# Patient Record
Sex: Male | Born: 1940 | Race: White | Hispanic: No | Marital: Married | Smoking: Former smoker
Health system: Southern US, Community
[De-identification: ages and names within clinical notes are randomized; demographics above are authoritative.]

## PROBLEM LIST (undated history)

## (undated) DIAGNOSIS — I10 Essential (primary) hypertension: Secondary | ICD-10-CM

---

## 2004-11-07 ENCOUNTER — Emergency Department: Payer: Self-pay | Admitting: Internal Medicine

## 2008-06-17 ENCOUNTER — Encounter: Payer: Self-pay | Admitting: Internal Medicine

## 2008-07-01 ENCOUNTER — Encounter: Payer: Self-pay | Admitting: Internal Medicine

## 2009-07-10 ENCOUNTER — Ambulatory Visit: Payer: Self-pay | Admitting: Internal Medicine

## 2019-07-31 ENCOUNTER — Other Ambulatory Visit: Payer: Self-pay

## 2019-07-31 ENCOUNTER — Ambulatory Visit (INDEPENDENT_AMBULATORY_CARE_PROVIDER_SITE_OTHER): Payer: Self-pay

## 2019-07-31 ENCOUNTER — Ambulatory Visit
Admission: EM | Admit: 2019-07-31 | Discharge: 2019-07-31 | Disposition: A | Discharge status: Home / Self Care | Payer: Self-pay | Attending: Family Medicine | Admitting: Family Medicine

## 2019-07-31 ENCOUNTER — Encounter: Payer: Self-pay | Admitting: Emergency Medicine

## 2019-07-31 DIAGNOSIS — W19XXXA Unspecified fall, initial encounter: Secondary | ICD-10-CM

## 2019-07-31 DIAGNOSIS — S7001XA Contusion of right hip, initial encounter: Secondary | ICD-10-CM

## 2019-07-31 DIAGNOSIS — M25531 Pain in right wrist: Secondary | ICD-10-CM

## 2019-07-31 DIAGNOSIS — M25551 Pain in right hip: Secondary | ICD-10-CM

## 2019-07-31 DIAGNOSIS — S40011A Contusion of right shoulder, initial encounter: Secondary | ICD-10-CM

## 2019-07-31 DIAGNOSIS — S60211A Contusion of right wrist, initial encounter: Secondary | ICD-10-CM

## 2019-07-31 DIAGNOSIS — S40021A Contusion of right upper arm, initial encounter: Secondary | ICD-10-CM

## 2019-07-31 DIAGNOSIS — M25511 Pain in right shoulder: Secondary | ICD-10-CM

## 2019-07-31 HISTORY — DX: Essential (primary) hypertension: I10

## 2019-07-31 MED ORDER — ACETAMINOPHEN 500 MG PO TABS
1000.0000 mg | ORAL_TABLET | Freq: Once | ORAL | Status: AC
  Administered 2019-07-31: 1000 mg via ORAL

## 2019-07-31 NOTE — ED Provider Notes (Signed)
[facility]    CSN: [id number] Arrival date & time: 07/31/19  1638      History   Chief Complaint Chief Complaint  Patient presents with  . Shoulder Pain  . Arm Pain  . Hip Pain  . Hand Pain    HPI REX OESTERLE is a 78 y.o. male.   78 yo male with a c/o right shoulder, wrist and hip pain after falling down some steps today. States that he tripped on step. Denies hitting his head or loss of consciousness.    Shoulder Pain Arm Pain  Hip Pain  Hand Pain    Past Medical History:  Diagnosis Date  . Hypertension     There are no problems to display for this patient.   History reviewed. No pertinent surgical history.     Home Medications    Prior to Admission medications   Not on File    Family History History reviewed. No pertinent family history.  Social History Social History   Tobacco Use  . Smoking status: Never Smoker  . Smokeless tobacco: Never Used  Substance Use Topics  . Alcohol use: Never  . Drug use: Never     Allergies   Patient has no known allergies.   Review of Systems Review of Systems   Physical Exam Triage Vital Signs ED Triage Vitals  Enc Vitals Group     BP 07/31/19 1655 (!) 162/76     Pulse Rate 07/31/19 1655 96     Resp 07/31/19 1655 18     Temp 07/31/19 1655 98.2 F (36.8 C)     Temp Source 07/31/19 1655 Oral     SpO2 07/31/19 1655 99 %     Weight 07/31/19 1652 185 lb (83.9 kg)     Height 07/31/19 1652 5\' 7"  (1.702 m)     Head Circumference --      Peak Flow --      Pain Score 07/31/19 1652 9     Pain Loc --      Pain Edu? --      Excl. in [place]? --    No data found.  Updated Vital Signs BP (!) 162/76 (BP Location: Right Arm)   Pulse 96   Temp 98.2 F (36.8 C) (Oral)   Resp 18   Ht 5\' 7"  (1.702 m)   Wt 83.9 kg   SpO2 99%   BMI 28.98 kg/m   Visual Acuity Right Eye Distance:   Left Eye Distance:   Bilateral Distance:    Right Eye Near:   Left Eye Near:    Bilateral Near:      Physical Exam Vitals and nursing note reviewed.  Constitutional:      General: He is not in acute distress.    Appearance: He is not toxic-appearing or diaphoretic.  Musculoskeletal:     Right shoulder: Bony tenderness present. No swelling, deformity, effusion, laceration or crepitus. Normal range of motion. Normal strength. Normal pulse.     Right wrist: Bony tenderness present. No swelling, deformity, effusion, lacerations, snuff box tenderness or crepitus. Normal range of motion. Normal pulse.     Right hip: Bony tenderness present. No deformity, lacerations or crepitus. Normal range of motion. Normal strength.     Comments: Extremities neurovascularly intact  Neurological:     Mental Status: He is alert.      UC Treatments / Results  Labs (all labs ordered are listed, but only abnormal results are displayed) Labs Reviewed -  No data to display  EKG   Radiology DG Shoulder Right  Result Date: 07/31/2019 CLINICAL DATA:  Status post fall. EXAM: RIGHT SHOULDER - 2+ VIEW COMPARISON:  None. FINDINGS: There is no evidence of fracture or dislocation. There is no evidence of arthropathy or other focal bone abnormality. Soft tissues are unremarkable. IMPRESSION: No acute osseous injury of the right shoulder. Electronically Signed   By: Elige Ko   On: 07/31/2019 17:47   DG Wrist Complete Right  Result Date: 07/31/2019 CLINICAL DATA:  Fall down stairs, pain in the right wrist/hand EXAM: RIGHT WRIST - COMPLETE 3+ VIEW COMPARISON:  None. FINDINGS: No acute bony findings. Minimal ridging along the lateral margin of the mid scaphoid without compelling findings of scaphoid fracture. Unremarkable appearance of the pronator fat pad on the lateral projection. IMPRESSION: 1. No significant abnormality identified. If the patient has point tenderness over the anatomic snuffbox, then cross-sectional imaging workup or presumptive treatment for occult scaphoid fracture might be considered.  Electronically Signed   By: Gaylyn Rong M.D.   On: 07/31/2019 17:50   DG Humerus Right  Result Date: 07/31/2019 CLINICAL DATA:  Fall down 5 stairs, right arm pain. EXAM: RIGHT HUMERUS - 2+ VIEW COMPARISON:  None. FINDINGS: There is no evidence of fracture or other focal bone lesions. Soft tissues are unremarkable. IMPRESSION: Negative. Electronically Signed   By: Gaylyn Rong M.D.   On: 07/31/2019 17:48   DG Hip Unilat With Pelvis 2-3 Views Right  Result Date: 07/31/2019 CLINICAL DATA:  Fall down stairs today.  Right hip pain. EXAM: DG HIP (WITH OR WITHOUT PELVIS) 2-3V RIGHT COMPARISON:  None. FINDINGS: Mild spurring of the right femoral head and acetabulum. No well-defined hip fracture. Penile implant. IMPRESSION: 1. No acute findings.  Minimal spurring of the right hip. Electronically Signed   By: Gaylyn Rong M.D.   On: 07/31/2019 17:51    Procedures Procedures (including critical care time)  Medications Ordered in UC Medications  acetaminophen (TYLENOL) tablet 1,000 mg (1,000 mg Oral Given 07/31/19 1711)    Initial Impression / Assessment and Plan / UC Course  I have reviewed the triage vital signs and the nursing notes.  Pertinent labs & imaging results that were available during my care of the patient were reviewed by me and considered in my medical decision making (see chart for details).      Final Clinical Impressions(s) / UC Diagnoses   Final diagnoses:  Fall  Contusion of multiple sites of right shoulder and upper arm, initial encounter  Contusion of right hip, initial encounter  Contusion of right wrist, initial encounter     Discharge Instructions     Rest, ice, tylenol    ED Prescriptions    None      1. X-ray results (negative for fracture) and diagnosis reviewed with patient 2. Recommend supportive treatment as above 3. Follow-up prn if symptoms worsen or don't improve  PDMP not reviewed this encounter.   Payton Mccallum,  MD 07/31/19 2013

## 2019-07-31 NOTE — Discharge Instructions (Signed)
Rest , ice, tylenol

## 2019-07-31 NOTE — ED Triage Notes (Signed)
Patient c/o falling down 5 steps today. He is c/o right shoulder, right arm, right hip and right hand pain. Denies LOC.

## 2020-04-29 IMAGING — CR DG HIP (WITH OR WITHOUT PELVIS) 2-3V*R*
3 series · 3 of 3 positions shown · non-contrast
Comparison: None.

CLINICAL DATA: Fall down stairs today.  Right hip pain.

EXAM:
DG HIP (WITH OR WITHOUT PELVIS) 2-3V RIGHT

[hip ap]
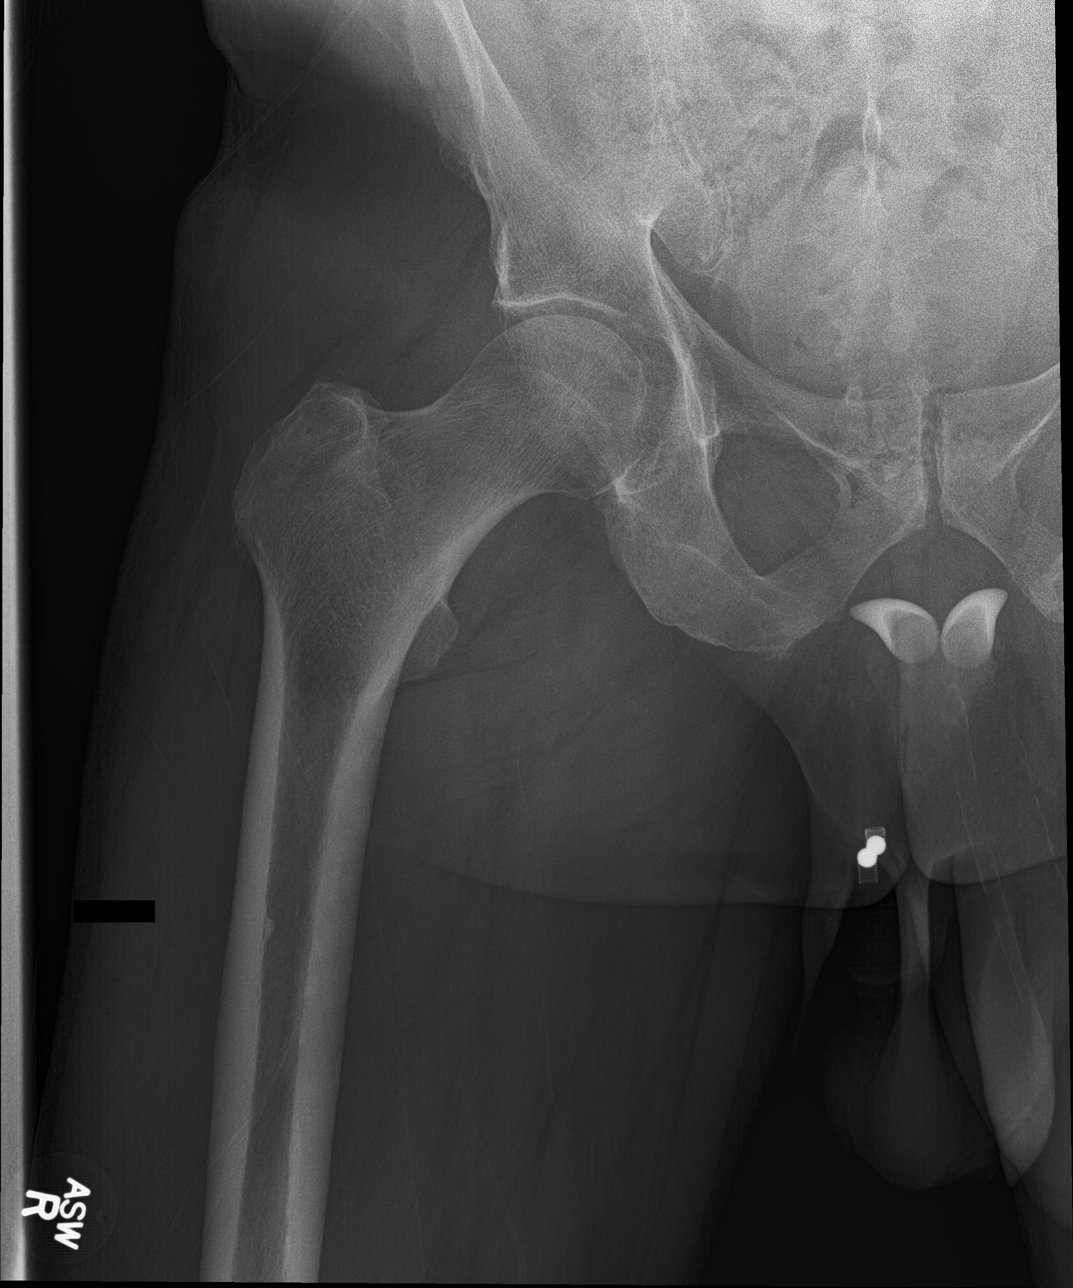

[hip lat]
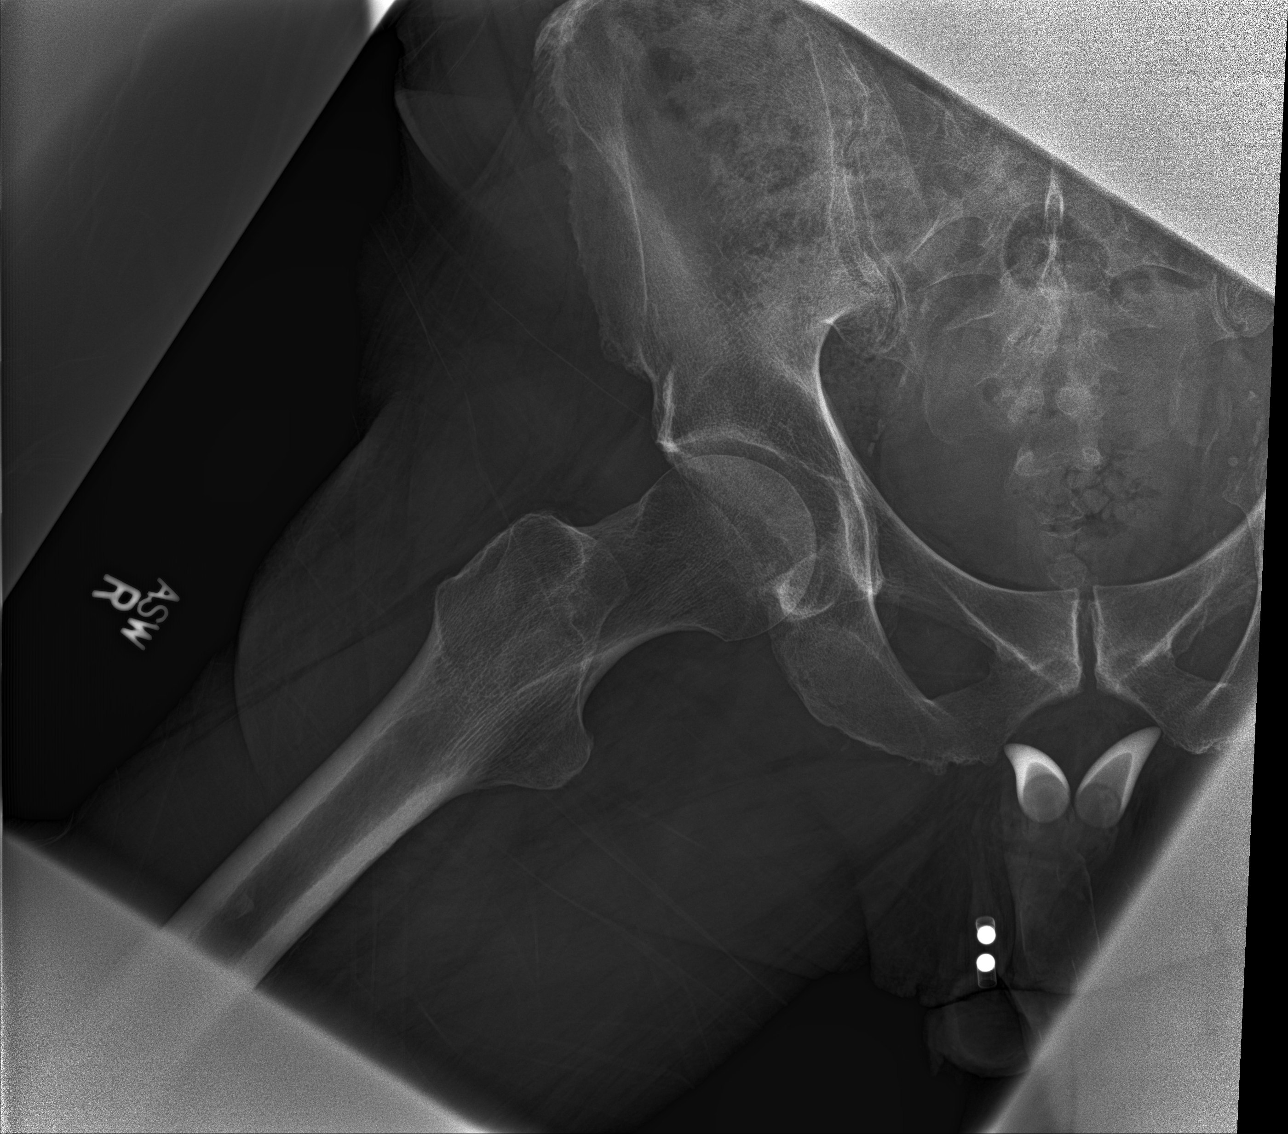

[pelvis ap]
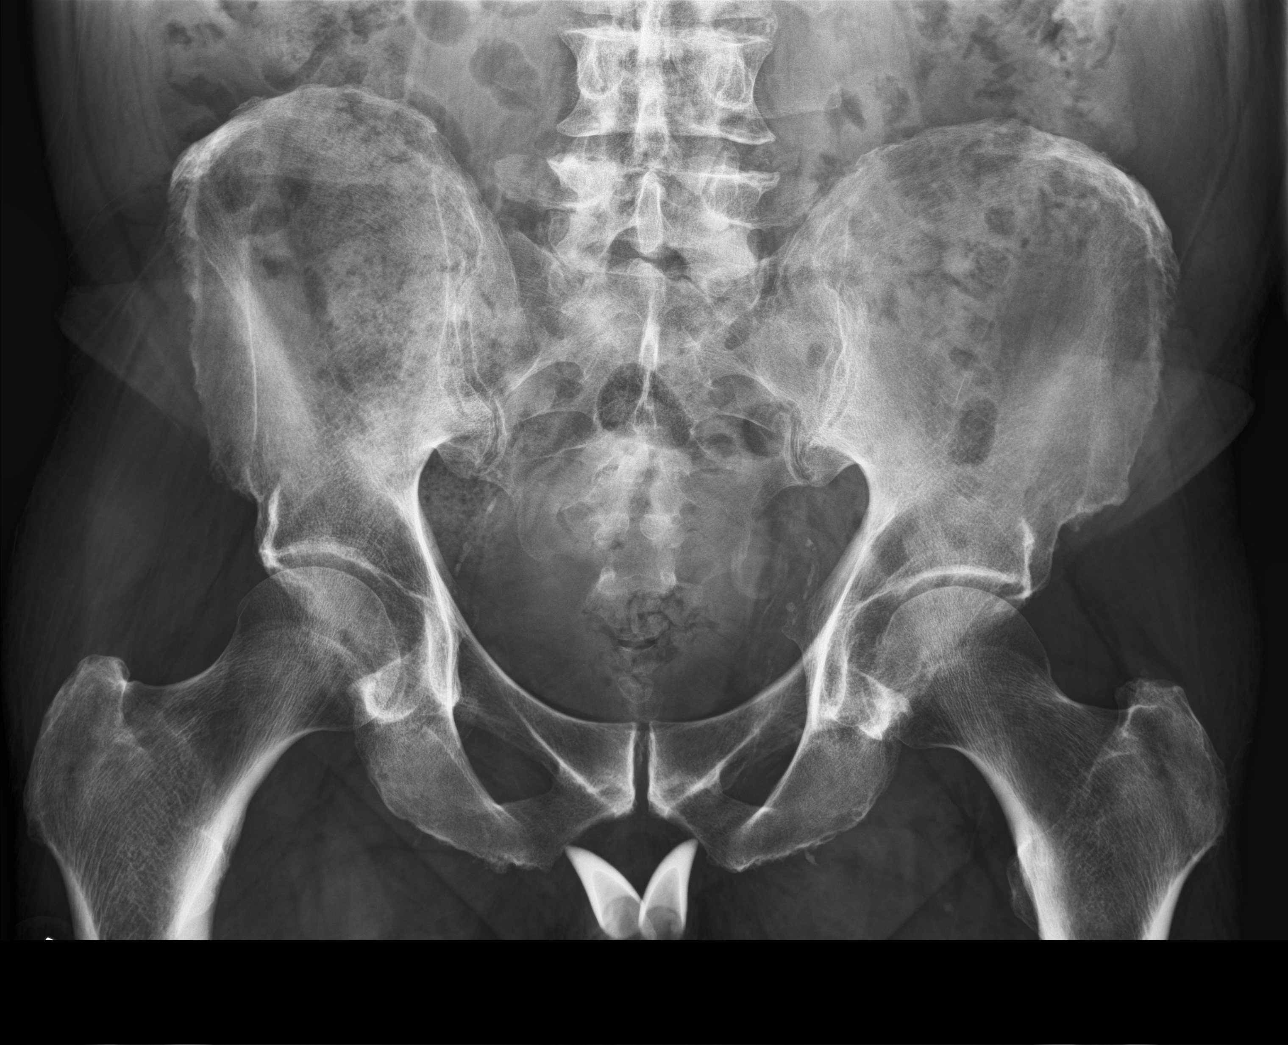

[3 of 3 positions shown; findings below may reference images not displayed]

FINDINGS: Mild spurring of the right femoral head and acetabulum. No
well-defined hip fracture. Penile implant.
IMPRESSION: 1. No acute findings.  Minimal spurring of the right hip.

## 2022-04-25 ENCOUNTER — Other Ambulatory Visit: Payer: Self-pay

## 2022-04-25 ENCOUNTER — Encounter: Payer: Self-pay | Attending: Cardiology

## 2022-04-25 DIAGNOSIS — Z952 Presence of prosthetic heart valve: Secondary | ICD-10-CM

## 2022-04-25 NOTE — Progress Notes (Signed)
Virtual Visit completed. Patient informed on EP and RD appointment and 6 Minute walk test. Patient also informed of patient health questionnaires on My Chart. Patient Verbalizes understanding. Visit diagnosis can be found in [facility] 02/14/2022.

## 2022-05-04 ENCOUNTER — Encounter: Payer: No Typology Code available for payment source | Attending: Nurse Practitioner

## 2022-05-04 VITALS — Ht 66.4 in | Wt 184.4 lb

## 2022-05-04 DIAGNOSIS — Z48812 Encounter for surgical aftercare following surgery on the circulatory system: Secondary | ICD-10-CM | POA: Diagnosis not present

## 2022-05-04 DIAGNOSIS — Z952 Presence of prosthetic heart valve: Secondary | ICD-10-CM | POA: Insufficient documentation

## 2022-05-04 NOTE — Patient Instructions (Signed)
Patient Instructions  Patient Details  Name: Dustin Lara MRN: [id number] Date of Birth: 10/27/1940 Referring Provider:  Toya Smothers, PA  Below are your personal goals for exercise, nutrition, and risk factors. Our goal is to help you stay on track towards obtaining and maintaining these goals. We will be discussing your progress on these goals with you throughout the program.  Initial Exercise Prescription:  Initial Exercise Prescription - 05/04/22 1500       Date of Initial Exercise RX and Referring Provider   Date 05/04/22    Referring Provider Thakkar      Oxygen   Maintain Oxygen Saturation 88% or higher      Treadmill   MPH 1.8    Grade 0.5    Minutes 15    METs 2.5      Recumbant Bike   Level 1    RPM 50    Watts 12    Minutes 15    METs 1.55      NuStep   Level 2    SPM 80    Minutes 15    METs 1.55      T5 Nustep   Level 1    SPM 80    Minutes 15    METs 1.55      Prescription Details   Frequency (times per week) 3    Duration Progress to 30 minutes of continuous aerobic without signs/symptoms of physical distress      Intensity   THRR 40-80% of Max Heartrate 93-123    Ratings of Perceived Exertion 11-13    Perceived Dyspnea 0-4      Progression   Progression Continue to progress workloads to maintain intensity without signs/symptoms of physical distress.      Resistance Training   Training Prescription Yes    Weight 3 lb    Reps 10-15             Exercise Goals: Frequency: Be able to perform aerobic exercise two to three times per week in program working toward 2-5 days per week of home exercise.  Intensity: Work with a perceived exertion of 11 (fairly light) - 15 (hard) while following your exercise prescription.  We will make changes to your prescription with you as you progress through the program.   Duration: Be able to do 30 to 45 minutes of continuous aerobic exercise in addition to a 5 minute warm-up and a 5 minute cool-down  routine.   Nutrition Goals: Your personal nutrition goals will be established when you do your nutrition analysis with the dietician.  The following are general nutrition guidelines to follow: Cholesterol < 200mg /day Sodium < 1500mg /day Fiber: Men over 50 yrs - 30 grams per day  Personal Goals:  Personal Goals and Risk Factors at Admission - 05/04/22 1501       Core Components/Risk Factors/Patient Goals on Admission    Weight Management Yes;Weight Maintenance    Intervention Weight Management: Develop a combined nutrition and exercise program designed to reach desired caloric intake, while maintaining appropriate intake of nutrient and fiber, sodium and fats, and appropriate energy expenditure required for the weight goal.;Weight Management: Provide education and appropriate resources to help participant work on and attain dietary goals.;Weight Management/Obesity: Establish reasonable short term and long term weight goals.    Admit Weight 184 lb 6.4 oz (83.6 kg)    Goal Weight: Short Term 180 lb (81.6 kg)    Goal Weight: Long Term 175 lb (79.4 kg)  Expected Outcomes Short Term: Continue to assess and modify interventions until short term weight is achieved;Long Term: Adherence to nutrition and physical activity/exercise program aimed toward attainment of established weight goal;Weight Maintenance: Understanding of the daily nutrition guidelines, which includes 25-35% calories from fat, 7% or less cal from saturated fats, less than 200mg  cholesterol, less than 1.5gm of sodium, & 5 or more servings of fruits and vegetables daily;Understanding recommendations for meals to include 15-35% energy as protein, 25-35% energy from fat, 35-60% energy from carbohydrates, less than 200mg  of dietary cholesterol, 20-35 gm of total fiber daily;Understanding of distribution of calorie intake throughout the day with the consumption of 4-5 meals/snacks    Diabetes Yes    Intervention Provide education about  signs/symptoms and action to take for hypo/hyperglycemia.;Provide education about proper nutrition, including hydration, and aerobic/resistive exercise prescription along with prescribed medications to achieve blood glucose in normal ranges: Fasting glucose 65-99 mg/dL    Expected Outcomes Short Term: Participant verbalizes understanding of the signs/symptoms and immediate care of hyper/hypoglycemia, proper foot care and importance of medication, aerobic/resistive exercise and nutrition plan for blood glucose control.;Long Term: Attainment of HbA1C < 7%.    Hypertension Yes    Intervention Provide education on lifestyle modifcations including regular physical activity/exercise, weight management, moderate sodium restriction and increased consumption of fresh fruit, vegetables, and low fat dairy, alcohol moderation, and smoking cessation.;Monitor prescription use compliance.    Expected Outcomes Short Term: Continued assessment and intervention until BP is < 140/46mm HG in hypertensive participants. < 130/19mm HG in hypertensive participants with diabetes, heart failure or chronic kidney disease.;Long Term: Maintenance of blood pressure at goal levels.    Lipids Yes    Intervention Provide education and support for participant on nutrition & aerobic/resistive exercise along with prescribed medications to achieve LDL 70mg , HDL >40mg .    Expected Outcomes Short Term: Participant states understanding of desired cholesterol values and is compliant with medications prescribed. Participant is following exercise prescription and nutrition guidelines.;Long Term: Cholesterol controlled with medications as prescribed, with individualized exercise RX and with personalized nutrition plan. Value goals: LDL < 70mg , HDL > 40 mg.             Tobacco Use Initial Evaluation: Social History   Tobacco Use  Smoking Status Former   Packs/day: 0.25   Years: 20.00   Total pack years: 5.00   Types: Cigarettes   Quit  date: 04/01/1974   Years since quitting: 48.1  Smokeless Tobacco Never    Exercise Goals and Review:  Exercise Goals     Row Name 05/04/22 1517             Exercise Goals   Increase Physical Activity Yes       Intervention Provide advice, education, support and counseling about physical activity/exercise needs.;Develop an individualized exercise prescription for aerobic and resistive training based on initial evaluation findings, risk stratification, comorbidities and participant's personal goals.       Expected Outcomes Short Term: Attend rehab on a regular basis to increase amount of physical activity.;Long Term: Add in home exercise to make exercise part of routine and to increase amount of physical activity.;Long Term: Exercising regularly at least 3-5 days a week.       Increase Strength and Stamina Yes       Intervention Develop an individualized exercise prescription for aerobic and resistive training based on initial evaluation findings, risk stratification, comorbidities and participant's personal goals.;Provide advice, education, support and counseling about physical activity/exercise needs.  Expected Outcomes Short Term: Increase workloads from initial exercise prescription for resistance, speed, and METs.;Short Term: Perform resistance training exercises routinely during rehab and add in resistance training at home;Long Term: Improve cardiorespiratory fitness, muscular endurance and strength as measured by increased METs and functional capacity (6MWT)       Able to understand and use rate of perceived exertion (RPE) scale Yes       Intervention Provide education and explanation on how to use RPE scale       Expected Outcomes Short Term: Able to use RPE daily in rehab to express subjective intensity level;Long Term:  Able to use RPE to guide intensity level when exercising independently       Able to understand and use Dyspnea scale Yes       Intervention Provide education and  explanation on how to use Dyspnea scale       Expected Outcomes Long Term: Able to use Dyspnea scale to guide intensity level when exercising independently;Short Term: Able to use Dyspnea scale daily in rehab to express subjective sense of shortness of breath during exertion       Knowledge and understanding of Target Heart Rate Range (THRR) Yes       Intervention Provide education and explanation of THRR including how the numbers were predicted and where they are located for reference       Expected Outcomes Short Term: Able to state/look up THRR;Long Term: Able to use THRR to govern intensity when exercising independently;Short Term: Able to use daily as guideline for intensity in rehab       Able to check pulse independently Yes       Intervention Provide education and demonstration on how to check pulse in carotid and radial arteries.;Review the importance of being able to check your own pulse for safety during independent exercise       Expected Outcomes Short Term: Able to explain why pulse checking is important during independent exercise;Long Term: Able to check pulse independently and accurately       Understanding of Exercise Prescription Yes       Intervention Provide education, explanation, and written materials on patient's individual exercise prescription       Expected Outcomes Short Term: Able to explain program exercise prescription;Long Term: Able to explain home exercise prescription to exercise independently

## 2022-05-04 NOTE — Progress Notes (Signed)
Cardiac Individual Treatment Plan  Patient Details  Name: Dustin Lara MRN: [id number] Date of Birth: 1940-10-06 Referring Provider:   Flowsheet Row Cardiac Rehab from 05/04/2022 in [facility] Cardiac and Pulmonary Rehab  Referring Provider Thakkar       Initial Encounter Date:  Flowsheet Row Cardiac Rehab from 05/04/2022 in [facility] Cardiac and Pulmonary Rehab  Date 05/04/22       Visit Diagnosis: S/P TAVR (transcatheter aortic valve replacement)  Patient's Home Medications on Admission:  Current Outpatient Medications:    acetaminophen (TYLENOL) 325 MG tablet, TAKE TWO TABLETS BY MOUTH THREE TIMES A DAY AS NEEDED, Disp: , Rfl:    albuterol (VENTOLIN HFA) 108 (90 Base) MCG/ACT inhaler, INHALE 2 PUFFS BY ORAL INHALATION 3 TIMES A DAY AS NEEDED FOR BREATHING. BE SURE TO WASH MOUTHPIECE WITH WARM WATER ONCE A WEEK, Disp: , Rfl:    Alogliptin Benzoate 6.25 MG TABS, TAKE ONE TABLET BY MOUTH EVERY MORNING FOR DIABETES, Disp: , Rfl:    amLODipine (NORVASC) 5 MG tablet, TAKE ONE-HALF TABLET BY MOUTH EVERY DAY ESSENTIAL HYPERTENSION FOR BLOOD PRESSURE, Disp: , Rfl:    ascorbic acid (VITAMIN C) 100 MG tablet, TAKE  BY MOUTH, Disp: , Rfl:    atorvastatin (LIPITOR) 20 MG tablet, TAKE ONE TABLET (20MG ) BY MOUTH AT BEDTIME, Disp: , Rfl:    Calcium Carb-Cholecalciferol 500-10 MG-MCG TABS, Take 1 tablet by mouth daily., Disp: , Rfl:    Calcium Carbonate-Vitamin D 250-3.125 MG-MCG TABS, TAKE  BY MOUTH, Disp: , Rfl:    chlorthalidone (HYGROTON) 25 MG tablet, TAKE ONE-HALF TABLET BY MOUTH EVERY DAY FOR BLOOD PRESSURE *IMPORTANT STOP TAKING  HYDROCHLOROTHIZAIDE, Disp: , Rfl:    ferrous gluconate (FERGON) 324 MG tablet, Take 1 tablet by mouth every other day., Disp: , Rfl:    ferrous sulfate 325 (65 FE) MG EC tablet, Take by mouth., Disp: , Rfl:    finasteride (PROSCAR) 5 MG tablet, Take 1 tablet by mouth daily., Disp: , Rfl:    fluticasone-salmeterol (ADVAIR) 100-50 MCG/ACT AEPB, INHALE 1 PUFF BY MOUTH EVERY  12 HOURS, Disp: , Rfl:    glipiZIDE (GLUCOTROL) 10 MG tablet, TAKE ONE TABLET BY MOUTH TWO TIMES A DAY . DOSE INCREASE, Disp: , Rfl:    glucose blood (PRECISION QID TEST) test strip, Use 1 strip twice a week, Disp: , Rfl:    ketoconazole (NIZORAL) 2 % shampoo, SHAMPOO AS DIRECTED TOPICALLY TUESDAY,THURSDAY,SATURDAY AS NEEDED, Disp: , Rfl:    losartan (COZAAR) 50 MG tablet, TAKE ONE TABLET BY MOUTH EVERY DAY ESSENTIAL HYPERTENSION, Disp: , Rfl:    polyethylene glycol (MIRALAX / GLYCOLAX) 17 g packet, Take by mouth., Disp: , Rfl:    senna-docusate (SENOKOT-S) 8.6-50 MG tablet, Take by mouth., Disp: , Rfl:    tamsulosin (FLOMAX) 0.4 MG CAPS capsule, Take 1 capsule by mouth every evening., Disp: , Rfl:    Tiotropium Bromide Monohydrate 2.5 MCG/ACT AERS, INHALE 2 INHALATIONS BY ORAL INHALATION EVERY DAY, Disp: , Rfl:    traZODone (DESYREL) 50 MG tablet, TAKE ONE AND ONE-HALF TABLETS BY MOUTH AT BEDTIME FOR SLEEP AND MOOD, Disp: , Rfl:   Past Medical History: Past Medical History:  Diagnosis Date   Hypertension     Tobacco Use: Social History   Tobacco Use  Smoking Status Former   Packs/day: 0.25   Years: 20.00   Total pack years: 5.00   Types: Cigarettes   Quit date: 04/01/1974   Years since quitting: 48.1  Smokeless Tobacco Never  Labs: Review Flowsheet        No data to display           Exercise Target Goals: Exercise Program Goal: Individual exercise prescription set using results from initial 6 min walk test and THRR while considering  patient's activity barriers and safety.   Exercise Prescription Goal: Initial exercise prescription builds to 30-45 minutes a day of aerobic activity, 2-3 days per week.  Home exercise guidelines will be given to patient during program as part of exercise prescription that the participant will acknowledge.   Education: Aerobic Exercise: - Group verbal and visual presentation on the components of exercise prescription. Introduces  F.I.T.T principle from ACSM for exercise prescriptions.  Reviews F.I.T.T. principles of aerobic exercise including progression. Written material given at graduation. Flowsheet Row Cardiac Rehab from 05/04/2022 in [facility] Cardiac and Pulmonary Rehab  Education need identified 05/04/22       Education: Resistance Exercise: - Group verbal and visual presentation on the components of exercise prescription. Introduces F.I.T.T principle from ACSM for exercise prescriptions  Reviews F.I.T.T. principles of resistance exercise including progression. Written material given at graduation.    Education: Exercise & Equipment Safety: - Individual verbal instruction and demonstration of equipment use and safety with use of the equipment. Flowsheet Row Cardiac Rehab from 05/04/2022 in [facility] Cardiac and Pulmonary Rehab  Date 04/25/22  Educator [facility]  Instruction Review Code 1- Verbalizes Understanding       Education: Exercise Physiology & General Exercise Guidelines: - Group verbal and written instruction with models to review the exercise physiology of the cardiovascular system and associated critical values. Provides general exercise guidelines with specific guidelines to those with heart or lung disease.    Education: Flexibility, Balance, Mind/Body Relaxation: - Group verbal and visual presentation with interactive activity on the components of exercise prescription. Introduces F.I.T.T principle from ACSM for exercise prescriptions. Reviews F.I.T.T. principles of flexibility and balance exercise training including progression. Also discusses the mind body connection.  Reviews various relaxation techniques to help reduce and manage stress (i.e. Deep breathing, progressive muscle relaxation, and visualization). Balance handout provided to take home. Written material given at graduation.   Activity Barriers & Risk Stratification:  Activity Barriers & Cardiac Risk Stratification - 05/04/22 1505        Activity Barriers & Cardiac Risk Stratification   Activity Barriers Muscular Weakness;Deconditioning;Shortness of Breath    Cardiac Risk Stratification High             6 Minute Walk:  6 Minute Walk     Row Name 05/04/22 1504         6 Minute Walk   Phase Initial     Distance 880 feet     Walk Time 6 minutes     # of Rest Breaks 0     MPH 1.67     METS 1.55     RPE 11     Perceived Dyspnea  1     VO2 Peak 5.43     Symptoms Yes (comment)     Comments Chest Tightness, SOB     Resting HR 64 bpm     Resting BP 128/56     Resting Oxygen Saturation  97 %     Exercise Oxygen Saturation  during 6 min walk 99 %     Max Ex. HR 103 bpm     Max Ex. BP 148/62     2 Minute Post BP 136/66  Oxygen Initial Assessment:   Oxygen Re-Evaluation:   Oxygen Discharge (Final Oxygen Re-Evaluation):   Initial Exercise Prescription:  Initial Exercise Prescription - 05/04/22 1500       Date of Initial Exercise RX and Referring Provider   Date 05/04/22    Referring Provider Thakkar      Oxygen   Maintain Oxygen Saturation 88% or higher      Treadmill   MPH 1.8    Grade 0.5    Minutes 15    METs 2.5      Recumbant Bike   Level 1    RPM 50    Watts 12    Minutes 15    METs 1.55      NuStep   Level 2    SPM 80    Minutes 15    METs 1.55      T5 Nustep   Level 1    SPM 80    Minutes 15    METs 1.55      Prescription Details   Frequency (times per week) 3    Duration Progress to 30 minutes of continuous aerobic without signs/symptoms of physical distress      Intensity   THRR 40-80% of Max Heartrate 93-123    Ratings of Perceived Exertion 11-13    Perceived Dyspnea 0-4      Progression   Progression Continue to progress workloads to maintain intensity without signs/symptoms of physical distress.      Resistance Training   Training Prescription Yes    Weight 3 lb    Reps 10-15             Perform Capillary Blood Glucose checks as  needed.  Exercise Prescription Changes:   Exercise Prescription Changes     Row Name 05/04/22 1500             Response to Exercise   Blood Pressure (Admit) 128/56       Blood Pressure (Exercise) 148/62       Blood Pressure (Exit) 136/66       Heart Rate (Admit) 64 bpm       Heart Rate (Exercise) 103 bpm       Heart Rate (Exit) 59 bpm       Oxygen Saturation (Admit) 97 %       Oxygen Saturation (Exercise) 99 %       Rating of Perceived Exertion (Exercise) 11       Perceived Dyspnea (Exercise) 1       Symptoms chest tightness, SOB       Comments Results                Exercise Comments:   Exercise Goals and Review:   Exercise Goals     Row Name 05/04/22 1517             Exercise Goals   Increase Physical Activity Yes       Intervention Provide advice, education, support and counseling about physical activity/exercise needs.;Develop an individualized exercise prescription for aerobic and resistive training based on initial evaluation findings, risk stratification, comorbidities and participant's personal goals.       Expected Outcomes Short Term: Attend rehab on a regular basis to increase amount of physical activity.;Long Term: Add in home exercise to make exercise part of routine and to increase amount of physical activity.;Long Term: Exercising regularly at least 3-5 days a week.       Increase Strength and Stamina Yes  Intervention Develop an individualized exercise prescription for aerobic and resistive training based on initial evaluation findings, risk stratification, comorbidities and participant's personal goals.;Provide advice, education, support and counseling about physical activity/exercise needs.       Expected Outcomes Short Term: Increase workloads from initial exercise prescription for resistance, speed, and METs.;Short Term: Perform resistance training exercises routinely during rehab and add in resistance training at home;Long Term:  Improve cardiorespiratory fitness, muscular endurance and strength as measured by increased METs and functional capacity ( )       Able to understand and use rate of perceived exertion (RPE) scale Yes       Intervention Provide education and explanation on how to use RPE scale       Expected Outcomes Short Term: Able to use RPE daily in rehab to express subjective intensity level;Long Term:  Able to use RPE to guide intensity level when exercising independently       Able to understand and use Dyspnea scale Yes       Intervention Provide education and explanation on how to use Dyspnea scale       Expected Outcomes Long Term: Able to use Dyspnea scale to guide intensity level when exercising independently;Short Term: Able to use Dyspnea scale daily in rehab to express subjective sense of shortness of breath during exertion       Knowledge and understanding of Target Heart Rate Range (THRR) Yes       Intervention Provide education and explanation of THRR including how the numbers were predicted and where they are located for reference       Expected Outcomes Short Term: Able to state/look up THRR;Long Term: Able to use THRR to govern intensity when exercising independently;Short Term: Able to use daily as guideline for intensity in rehab       Able to check pulse independently Yes       Intervention Provide education and demonstration on how to check pulse in carotid and radial arteries.;Review the importance of being able to check your own pulse for safety during independent exercise       Expected Outcomes Short Term: Able to explain why pulse checking is important during independent exercise;Long Term: Able to check pulse independently and accurately       Understanding of Exercise Prescription Yes       Intervention Provide education, explanation, and written materials on patient's individual exercise prescription       Expected Outcomes Short Term: Able to explain program exercise  prescription;Long Term: Able to explain home exercise prescription to exercise independently                Exercise Goals Re-Evaluation :   Discharge Exercise Prescription (Final Exercise Prescription Changes):  Exercise Prescription Changes - 05/04/22 1500       Response to Exercise   Blood Pressure (Admit) 128/56    Blood Pressure (Exercise) 148/62    Blood Pressure (Exit) 136/66    Heart Rate (Admit) 64 bpm    Heart Rate (Exercise) 103 bpm    Heart Rate (Exit) 59 bpm    Oxygen Saturation (Admit) 97 %    Oxygen Saturation (Exercise) 99 %    Rating of Perceived Exertion (Exercise) 11    Perceived Dyspnea (Exercise) 1    Symptoms chest tightness, SOB    Comments Results             Nutrition:  Target Goals: Understanding of nutrition guidelines, daily intake of sodium 1500mg , cholesterol 200mg ,  calories 30% from fat and 7% or less from saturated fats, daily to have 5 or more servings of fruits and vegetables.  Education: All About Nutrition: -Group instruction provided by verbal, written material, interactive activities, discussions, models, and posters to present general guidelines for heart healthy nutrition including fat, fiber, MyPlate, the role of sodium in heart healthy nutrition, utilization of the nutrition label, and utilization of this knowledge for meal planning. Follow up email sent as well. Written material given at graduation. Flowsheet Row Cardiac Rehab from 05/04/2022 in [facility] Cardiac and Pulmonary Rehab  Education need identified 05/04/22       Biometrics:  Pre Biometrics - 05/04/22 1517       Pre Biometrics   Height 5' 6.4" (1.687 m)    Weight 184 lb 6.4 oz (83.6 kg)    Waist Circumference 41 inches    Hip Circumference 41.5 inches    Waist to Hip Ratio 0.99 %    BMI (Calculated) 29.39    Single Leg Stand 5.45 seconds   L             Nutrition Therapy Plan and Nutrition Goals:  Nutrition Therapy & Goals - 05/04/22 1445        Personal Nutrition Goals   Comments He chooses whole wheat bread instead of white bread. Food recall: B: bowl of cereal (frosted wheat as well as another cereal with pecans and freeze dried strawberries, 2% milk) or bacon/sausage and eggs (tears up whole wheat bread to eat it). Sometimes won't eat anything in the morning. S: sometimes pack of nabs D: tonight he will have hot dogs with cheese (1x/week), homemade tacos, sandwich (whole wheat bread), [employer]. He goes out to eat 2x/week. Drinks: diet soda (1-3) or flavored water, water. He reports his BG has been running higher than normal. he reports his breathing has improved overall since being in the hospital.      Intervention Plan   Intervention Prescribe, educate and counsel regarding individualized specific dietary modifications aiming towards targeted core components such as weight, hypertension, lipid management, diabetes, heart failure and other comorbidities.;Nutrition handout(s) given to patient.    Expected Outcomes Short Term Goal: Understand basic principles of dietary content, such as calories, fat, sodium, cholesterol and nutrients.;Short Term Goal: A plan has been developed with personal nutrition goals set during dietitian appointment.;Long Term Goal: Adherence to prescribed nutrition plan.             Nutrition Assessments:  MEDIFICTS Score Key: ?70 Need to make dietary changes  40-70 Heart Healthy Diet ? 40 Therapeutic Level Cholesterol Diet  Flowsheet Row [facility]  Picture Your Plate Total Score on Admission 53      Picture Your Plate Scores: <16 Unhealthy dietary pattern with much room for improvement. 41-50 Dietary pattern unlikely to meet recommendations for good health and room for improvement. 51-60 More healthful dietary pattern, with some room for improvement.  >60 Healthy dietary pattern, although there may be some specific behaviors that could  be improved.    Nutrition Goals Re-Evaluation:   Nutrition Goals Discharge (Final Nutrition Goals Re-Evaluation):   Psychosocial: Target Goals: Acknowledge presence or absence of significant depression and/or stress, maximize coping skills, provide positive support system. Participant is able to verbalize types and ability to use techniques and skills needed for reducing stress and depression.   Education: Stress, Anxiety, and Depression - Group verbal and visual presentation to define topics covered.  Reviews  how body is impacted by stress, anxiety, and depression.  Also discusses healthy ways to reduce stress and to treat/manage anxiety and depression.  Written material given at graduation. Flowsheet Row Cardiac Rehab from 05/04/2022 in [facility] Cardiac and Pulmonary Rehab  Education need identified 05/04/22       Education: Sleep Hygiene -Provides group verbal and written instruction about how sleep can affect your health.  Define sleep hygiene, discuss sleep cycles and impact of sleep habits. Review good sleep hygiene tips.    Initial Review & Psychosocial Screening:  Initial Psych Review & Screening - 04/25/22 1036       Initial Review   Current issues with None Identified      Family Dynamics   Good Support System? Yes    Comments Renteria daughter and step son are good support systems for him. He other Marlaine Hind looks after him as well. He lives by himself, his wife passed away four years ago.      Barriers   Psychosocial barriers to participate in program There are no identifiable barriers or psychosocial needs.;The patient should benefit from training in stress management and relaxation.      Screening Interventions   Interventions Encouraged to exercise;To provide support and resources with identified psychosocial needs;Provide feedback about the scores to participant    Expected Outcomes Short Term goal: Utilizing psychosocial counselor, staff and physician to assist  with identification of specific Stressors or current issues interfering with healing process. Setting desired goal for each stressor or current issue identified.;Long Term Goal: Stressors or current issues are controlled or eliminated.;Short Term goal: Identification and review with participant of any Quality of Life or Depression concerns found by scoring the questionnaire.;Long Term goal: The participant improves quality of Life and PHQ9 Scores as seen by post scores and/or verbalization of changes             Quality of Life Scores:   Quality of Life - 05/04/22 1459       Quality of Life   Select Quality of Life      Quality of Life Scores   Health/Function Pre 24.36 %    Socioeconomic Pre 25.86 %    Psych/Spiritual Pre 29.14 %    Family Pre 22.88 %    GLOBAL Pre 25.55 %            Scores of 19 and below usually indicate a poorer quality of life in these areas.  A difference of  2-3 points is a clinically meaningful difference.  A difference of 2-3 points in the total score of the Quality of Life Index has been associated with significant improvement in overall quality of life, self-image, physical symptoms, and general health in studies assessing change in quality of life.  PHQ-9: Review Flowsheet        No data to display         Interpretation of Total Score  Total Score Depression Severity:  1-4 = Minimal depression, 5-9 = Mild depression, 10-14 = Moderate depression, 15-19 = Moderately severe depression, 20-27 = Severe depression   Psychosocial Evaluation and Intervention:  Psychosocial Evaluation - 04/25/22 1038       Psychosocial Evaluation & Interventions   Interventions Encouraged to exercise with the program and follow exercise prescription;Stress management education;Relaxation education    Comments Hymes daughter and step son are good support systems for him. He other Marlaine Hind looks after him as well. He lives by himself, his wife passed away four  years ago.    Expected Outcomes Short: Start HeartTrack to help with mood. Long: Maintain a healthy mental state    Continue Psychosocial Services  Follow up required by staff             Psychosocial Re-Evaluation:   Psychosocial Discharge (Final Psychosocial Re-Evaluation):   Vocational Rehabilitation: Provide vocational rehab assistance to qualifying candidates.   Vocational Rehab Evaluation & Intervention:   Education: Education Goals: Education classes will be provided on a variety of topics geared toward better understanding of heart health and risk factor modification. Participant will state understanding/return demonstration of topics presented as noted by education test scores.  Learning Barriers/Preferences:  Learning Barriers/Preferences - 04/25/22 1032       Learning Barriers/Preferences   Learning Barriers None    Learning Preferences None             General Cardiac Education Topics:  AED/CPR: - Group verbal and written instruction with the use of models to demonstrate the basic use of the AED with the basic ABC's of resuscitation.   Anatomy and Cardiac Procedures: - Group verbal and visual presentation and models provide information about basic cardiac anatomy and function. Reviews the testing methods done to diagnose heart disease and the outcomes of the test results. Describes the treatment choices: Medical Management, Angioplasty, or Coronary Bypass Surgery for treating various heart conditions including Myocardial Infarction, Angina, Valve Disease, and Cardiac Arrhythmias.  Written material given at graduation. Flowsheet Row Card[facility]  Education need identified 05/04/22       Medication Safety: - Group verbal and visual instruction to review commonly prescribed medications for heart and lung disease. Reviews the medication, class of the drug, and side effects. Includes the steps to properly  store meds and maintain the prescription regimen.  Written material given at graduation.   Intimacy: - Group verbal instruction through game format to discuss how heart and lung disease can affect sexual intimacy. Written material given at graduation..   Know Your Numbers and Heart Failure: - Group verbal and visual instruction to discuss disease risk factors for cardiac and pulmonary disease and treatment options.  Reviews associated critical values for Overweight/Obesity, Hypertension, Cholesterol, and Diabetes.  Discusses basics of heart failure: signs/symptoms and treatments.  Introduces Heart Failure Zone chart for action plan for heart failure.  Written material given at graduation.   Infection Prevention: - Provides verbal and written material to individual with discussion of infection control including proper hand washing and proper equipment cleaning during exercise session. Flowsheet Row [facility]  Date 04/25/22  [facility]ator [facility]  Instruction Review Code 1- Verbalizes Understanding       Falls Prevention: - Provides verbal and written material to individual with discussion of falls prevention and safety. Flowsheet Row [facility]  Date 04/25/22  [facility]ator [facility]  Instruction Review Code 1- Verbalizes Understanding       Other: -Provides group and verbal instruction on various topics (see comments)   Knowledge Questionnaire Score:  Knowledge Questionnaire Score - 05/04/22 1501       Knowledge Questionnaire Score   Pre Score 18/26             Core Components/Risk Factors/Patient Goals at Admission:  Personal Goals and Risk Factors at Admission - 05/04/22 1501       Core Components/Risk Factors/Patient Goals on Admission  Weight Management Yes;Weight Maintenance    Intervention Weight Management: Develop a combined nutrition and exercise program designed to  reach desired caloric intake, while maintaining appropriate intake of nutrient and fiber, sodium and fats, and appropriate energy expenditure required for the weight goal.;Weight Management: Provide education and appropriate resources to help participant work on and attain dietary goals.;Weight Management/Obesity: Establish reasonable short term and long term weight goals.    Admit Weight 184 lb 6.4 oz (83.6 kg)    Goal Weight: Short Term 180 lb (81.6 kg)    Goal Weight: Long Term 175 lb (79.4 kg)    Expected Outcomes Short Term: Continue to assess and modify interventions until short term weight is achieved;Long Term: Adherence to nutrition and physical activity/exercise program aimed toward attainment of established weight goal;Weight Maintenance: Understanding of the daily nutrition guidelines, which includes 25-35% calories from fat, 7% or less cal from saturated fats, less than  cholesterol, less than 1.5gm of sodium, & 5 or more servings of fruits and vegetables daily;Understanding recommendations for meals to include 15-35% energy as protein, 25-35% energy from fat, 35-60% energy from carbohydrates, less than  of dietary cholesterol, 20-35 gm of total fiber daily;Understanding of distribution of calorie intake throughout the day with the consumption of 4-5 meals/snacks    Diabetes Yes    Intervention Provide education about signs/symptoms and action to take for hypo/hyperglycemia.;Provide education about proper nutrition, including hydration, and aerobic/resistive exercise prescription along with prescribed medications to achieve blood glucose in normal ranges: Fasting glucose 65-99 mg/dL    Expected Outcomes Short Term: Participant verbalizes understanding of the signs/symptoms and immediate care of hyper/hypoglycemia, proper foot care and importance of medication, aerobic/resistive exercise and nutrition plan for blood glucose control.;Long Term: Attainment of HbA1C < 7%.    Hypertension  Yes    Intervention Provide education on lifestyle modifcations including regular physical activity/exercise, weight management, moderate sodium restriction and increased consumption of fresh fruit, vegetables, and low fat dairy, alcohol moderation, and smoking cessation.;Monitor prescription use compliance.    Expected Outcomes Short Term: Continued assessment and intervention until BP is < 140/40mm HG in hypertensive participants. < 130/59mm HG in hypertensive participants with diabetes, heart failure or chronic kidney disease.;Long Term: Maintenance of blood pressure at goal levels.    Lipids Yes    Intervention Provide education and support for participant on nutrition & aerobic/resistive exercise along with prescribed medications to achieve LDL 70mg , HDL >40mg .    Expected Outcomes Short Term: Participant states understanding of desired cholesterol values and is compliant with medications prescribed. Participant is following exercise prescription and nutrition guidelines.;Long Term: Cholesterol controlled with medications as prescribed, with individualized exercise RX and with personalized nutrition plan. Value goals: LDL < , HDL > 40 mg.             Education:Diabetes - Individual verbal and written instruction to review signs/symptoms of diabetes, desired ranges of glucose level fasting, after meals and with exercise. Acknowledge that pre and post exercise glucose checks will be done for 3 sessions at entry of program. Flowsheet Row Cardiac Rehab from 05/04/2022 in [facility] Cardiac and Pulmonary Rehab  Date 04/25/22  Educator [facility]  Instruction Review Code 1- Verbalizes Understanding       Core Components/Risk Factors/Patient Goals Review:    Core Components/Risk Factors/Patient Goals at Discharge (Final Review):    ITP Comments:  ITP Comments     Row Name 04/25/22 1035 05/04/22 1459         ITP Comments Virtual Visit  completed. Patient informed on EP and RD appointment and 6  Minute walk test. Patient also informed of patient health questionnaires on My Chart. Patient Verbalizes understanding. Visit diagnosis can be found in [facility] 02/14/2022. Completed 6MWT and gym orientation. Initial ITP created and sent for review to Dr. Emily Filbert, Medical Director.               Comments: Initial ITP

## 2022-05-05 ENCOUNTER — Encounter: Payer: No Typology Code available for payment source | Admitting: *Deleted

## 2022-05-05 DIAGNOSIS — Z952 Presence of prosthetic heart valve: Secondary | ICD-10-CM

## 2022-05-05 LAB — GLUCOSE, CAPILLARY
Glucose-Capillary: 153 mg/dL — ABNORMAL HIGH (ref 70–99)
Glucose-Capillary: 140 mg/dL — ABNORMAL HIGH (ref 70–99)

## 2022-05-05 NOTE — Progress Notes (Signed)
Daily Session Note  Patient Details  Name: Dustin Lara MRN: [id number] Date of Birth: 13-Jul-1941 Referring Provider:   Flowsheet Row Cardiac Rehab from 05/04/2022 in [facility] Cardiac and Pulmonary Rehab  Referring Provider Thakkar       Encounter Date: 05/05/2022  Check In:  Session Check In - 05/05/22 [place]       Check-In   Supervising physician immediately available to respond to emergencies See telemetry face sheet for immediately available ER MD    Location [facility]-Cardiac & Pulmonary Rehab    Staff Present Justin Mend, RCP,RRT,BSRT;Noah Tickle, BS, Exercise Physiologist;Joanette Silveria, RN, BSN, CCRP    Virtual Visit No    Medication changes reported     No    Fall or balance concerns reported    No    Warm-up and Cool-down Performed on first and last piece of equipment    Resistance Training Performed Yes    VAD Patient? No    PAD/SET Patient? No      Pain Assessment   Currently in Pain? No/denies                Social History   Tobacco Use  Smoking Status Former   Packs/day: 0.25   Years: 20.00   Total pack years: 5.00   Types: Cigarettes   Quit date: 04/01/1974   Years since quitting: 48.1  Smokeless Tobacco Never    Goals Met:  Exercise tolerated well Personal goals reviewed No report of concerns or symptoms today  Goals Unmet:  Not Applicable  Comments: First full day of exercise!  Patient was oriented to gym and equipment including functions, settings, policies, and procedures.  Patient's individual exercise prescription and treatment plan were reviewed.  All starting workloads were established based on the results of the 6 minute walk test done at initial orientation visit.  The plan for exercise progression was also introduced and progression will be customized based on patient's performance and goals.    Dr. Emily Filbert is Medical Director for [place].  Dr. Ottie Glazier is Medical Director for [facility] Pulmonary  Rehabilitation.

## 2022-05-09 ENCOUNTER — Encounter: Payer: No Typology Code available for payment source | Admitting: *Deleted

## 2022-05-09 DIAGNOSIS — Z952 Presence of prosthetic heart valve: Secondary | ICD-10-CM

## 2022-05-09 LAB — GLUCOSE, CAPILLARY: Glucose-Capillary: 369 mg/dL — ABNORMAL HIGH (ref 70–99)

## 2022-05-09 NOTE — Progress Notes (Signed)
Incomplete Session Note  Patient Details  Name: Dustin Lara MRN: [id number] Date of Birth: [birth year] Referring Provider:   Flowsheet Row Cardiac Rehab from 05/04/2022 in [facility] Cardiac and Pulmonary Rehab  Referring Provider [name] did not complete his rehab session.  Pt's blood sugar was 369 on arrival. Pt was asymptomatic. Education provided. Instructions given on blood sugar management prior to next session.

## 2022-05-11 ENCOUNTER — Encounter: Payer: No Typology Code available for payment source | Admitting: *Deleted

## 2022-05-11 DIAGNOSIS — Z952 Presence of prosthetic heart valve: Secondary | ICD-10-CM

## 2022-05-11 LAB — GLUCOSE, CAPILLARY
Glucose-Capillary: 138 mg/dL — ABNORMAL HIGH (ref 70–99)
Glucose-Capillary: 150 mg/dL — ABNORMAL HIGH (ref 70–99)

## 2022-05-11 NOTE — Progress Notes (Signed)
Daily Session Note  Patient Details  Name: ZIMIR KITTLESON MRN: [id number] Date of Birth: 06-12-1941 Referring Provider:   Flowsheet Row Cardiac Rehab from 05/04/2022 in [facility] Cardiac and Pulmonary Rehab  Referring Provider Thakkar       Encounter Date: 05/11/2022  Check In:  Session Check In - 05/11/22 1527       Check-In   Supervising physician immediately available to respond to emergencies See telemetry face sheet for immediately available ER MD    Location [facility]-Cardiac & Pulmonary Rehab    Staff Present Earlean Shawl, BS, ACSM CEP, Exercise Physiologist;Beatric Fulop Tessie Fass, [place]    Virtual Visit No    Medication changes reported     No    Fall or balance concerns reported    No    Warm-up and Cool-down Performed on first and last piece of equipment    Resistance Training Performed Yes    VAD Patient? No      Pain Assessment   Currently in Pain? No/denies    Multiple Pain Sites No                Social History   Tobacco Use  Smoking Status Former   Packs/day: 0.25   Years: 20.00   Total pack years: 5.00   Types: Cigarettes   Quit date: 04/01/1974   Years since quitting: 48.1  Smokeless Tobacco Never    Goals Met:  Proper associated with RPD/PD & O2 Sat Exercise tolerated well No report of concerns or symptoms today Strength training completed today  Goals Unmet:  Not Applicable  Comments: Pt able to follow exercise prescription today without complaint.  Will continue to monitor for progression.    Dr. Emily Filbert is Medical Director for [place].  Dr. Ottie Glazier is Medical Director for [facility] Pulmonary Rehabilitation.

## 2022-05-12 ENCOUNTER — Encounter: Payer: No Typology Code available for payment source | Admitting: *Deleted

## 2022-05-12 DIAGNOSIS — Z952 Presence of prosthetic heart valve: Secondary | ICD-10-CM | POA: Diagnosis not present

## 2022-05-12 NOTE — Progress Notes (Signed)
Daily Session Note  Patient Details  Name: Dustin Lara MRN: [id number] Date of Birth: 21-Feb-1941 Referring Provider:   Flowsheet Row Cardiac Rehab from 05/04/2022 in [facility] Cardiac and Pulmonary Rehab  Referring Provider Thakkar       Encounter Date: 05/12/2022  Check In:  Session Check In - 05/12/22 1527       Check-In   Supervising physician immediately available to respond to emergencies See telemetry face sheet for immediately available ER MD    Location [facility]-Cardiac & Pulmonary Rehab    Staff Present [place] Sam, MA, RCEP, CCRP, CCET;Joseph [place], [place], RN, [place]    Virtual Visit No    Medication changes reported     No    Fall or balance concerns reported    No    Warm-up and Cool-down Performed on first and last piece of equipment    Resistance Training Performed Yes    VAD Patient? No    PAD/SET Patient? No      Pain Assessment   Currently in Pain? No/denies                Social History   Tobacco Use  Smoking Status Former   Packs/day: 0.25   Years: 20.00   Total pack years: 5.00   Types: Cigarettes   Quit date: 04/01/1974   Years since quitting: 48.1  Smokeless Tobacco Never    Goals Met:  Independence with exercise equipment Exercise tolerated well No report of concerns or symptoms today Strength training completed today  Goals Unmet:  Not Applicable  Comments: Pt able to follow exercise prescription today without complaint.  Will continue to monitor for progression.    Dr. Emily Filbert is Medical Director for [place].  Dr. Ottie Glazier is Medical Director for [facility] Pulmonary Rehabilitation.

## 2022-05-16 ENCOUNTER — Encounter: Payer: No Typology Code available for payment source | Admitting: *Deleted

## 2022-05-16 DIAGNOSIS — Z952 Presence of prosthetic heart valve: Secondary | ICD-10-CM | POA: Diagnosis not present

## 2022-05-16 NOTE — Progress Notes (Signed)
Daily Session Note  Patient Details  Name: Dustin Lara MRN: [id number] Date of Birth: [birth year] Referring Provider:   Flowsheet Row Cardiac Rehab from 05/04/2022 in [facility] Cardiac and Pulmonary Rehab  Referring Provider Thakkar       Encounter Date: 05/16/2022  Check In:  Session Check In - 05/16/22 1542       Check-In   Supervising physician immediately available to respond to emergencies See telemetry face sheet for immediately available ER MD    Location [facility]-Cardiac & Pulmonary Rehab    Staff Present Coralie Keens, MS, ASCM CEP, Exercise Physiologist;Suad Autrey Sherryll Burger, RN BSN;Jessica Luan Pulling, MA, RCEP, CCRP, CCET;Joseph [place], [place]    Virtual Visit No    Medication changes reported     No    Fall or balance concerns reported    No    Warm-up and Cool-down Performed on first and last piece of equipment    Resistance Training Performed Yes    VAD Patient? No    PAD/SET Patient? No      Pain Assessment   Currently in Pain? No/denies                Social History   Tobacco Use  Smoking Status Former   Packs/day: 0.25   Years: 20.00   Total pack years: 5.00   Types: Cigarettes   Quit date: 04/01/1974   Years since quitting: 48.1  Smokeless Tobacco Never    Goals Met:  Independence with exercise equipment Exercise tolerated well No report of concerns or symptoms today Strength training completed today  Goals Unmet:  Not Applicable  Comments: Pt able to follow exercise prescription today without complaint.  Will continue to monitor for progression.    Dr. Emily Filbert is Medical Director for [facility].  Dr. Ottie Glazier is Medical Director for [facility] Pulmonary Rehabilitation.

## 2022-05-18 ENCOUNTER — Encounter: Payer: No Typology Code available for payment source | Admitting: *Deleted

## 2022-05-18 DIAGNOSIS — Z952 Presence of prosthetic heart valve: Secondary | ICD-10-CM | POA: Diagnosis not present

## 2022-05-18 NOTE — Progress Notes (Signed)
Daily Session Note  Patient Details  Name: Dustin Lara MRN: [id number] Date of Birth: 10/08/1940 Referring Provider:   Flowsheet Row Cardiac Rehab from 05/04/2022 in [facility] Cardiac and Pulmonary Rehab  Referring Provider Thakkar       Encounter Date: 05/18/2022  Check In:  Session Check In - 05/18/22 1615       Check-In   Supervising physician immediately available to respond to emergencies See telemetry face sheet for immediately available ER MD    Location [facility]-Cardiac & Pulmonary Rehab    Staff Present Earlean Shawl, BS, ACSM CEP, Exercise Physiologist;Meredith Sherryll Burger, RN Odelia Gage, RN, ADN    Virtual Visit No    Medication changes reported     No    Fall or balance concerns reported    No    Warm-up and Cool-down Performed on first and last piece of equipment    Resistance Training Performed Yes    VAD Patient? No    PAD/SET Patient? No      Pain Assessment   Currently in Pain? No/denies                Social History   Tobacco Use  Smoking Status Former   Packs/day: 0.25   Years: 20.00   Total pack years: 5.00   Types: Cigarettes   Quit date: 04/01/1974   Years since quitting: 48.1  Smokeless Tobacco Never    Goals Met:  Independence with exercise equipment Exercise tolerated well No report of concerns or symptoms today Strength training completed today  Goals Unmet:  Not Applicable  Comments: Pt able to follow exercise prescription today without complaint.  Will continue to monitor for progression.    Dr. Emily Filbert is Medical Director for [place].  Dr. Ottie Glazier is Medical Director for [facility] Pulmonary Rehabilitation.

## 2022-05-19 ENCOUNTER — Encounter: Payer: No Typology Code available for payment source | Admitting: *Deleted

## 2022-05-19 DIAGNOSIS — Z952 Presence of prosthetic heart valve: Secondary | ICD-10-CM

## 2022-05-19 NOTE — Progress Notes (Signed)
Daily Session Note  Patient Details  Name: Dustin Lara MRN: [id number] Date of Birth: 1941/02/03 Referring Provider:   Flowsheet Row Cardiac Rehab from 05/04/2022 in [facility] Cardiac and Pulmonary Rehab  Referring Provider Thakkar       Encounter Date: 05/19/2022  Check In:  Session Check In - 05/19/22 1534       Check-In   Supervising physician immediately available to respond to emergencies See telemetry face sheet for immediately available ER MD    Location [facility]-Cardiac & Pulmonary Rehab    Staff Present Justin Mend, Lorre Nick, MA, RCEP, CCRP, CCET;Keegan Bensch Sherryll Burger, RN BSN    Virtual Visit No    Medication changes reported     No    Fall or balance concerns reported    No    Warm-up and Cool-down Performed on first and last piece of equipment    Resistance Training Performed Yes    VAD Patient? No    PAD/SET Patient? No      Pain Assessment   Currently in Pain? No/denies                Social History   Tobacco Use  Smoking Status Former   Packs/day: 0.25   Years: 20.00   Total pack years: 5.00   Types: Cigarettes   Quit date: 04/01/1974   Years since quitting: 48.1  Smokeless Tobacco Never    Goals Met:  Independence with exercise equipment Exercise tolerated well No report of concerns or symptoms today Strength training completed today  Goals Unmet:  Not Applicable  Comments: Pt able to follow exercise prescription today without complaint.  Will continue to monitor for progression.    Dr. Emily Filbert is Medical Director for [place].  Dr. Ottie Glazier is Medical Director for [facility] Pulmonary Rehabilitation.

## 2022-05-23 ENCOUNTER — Encounter: Payer: No Typology Code available for payment source | Admitting: *Deleted

## 2022-05-23 DIAGNOSIS — Z952 Presence of prosthetic heart valve: Secondary | ICD-10-CM

## 2022-05-23 NOTE — Progress Notes (Signed)
Daily Session Note  Patient Details  Name: Dustin Lara MRN: [id number] Date of Birth: February 20, 1941 Referring Provider:   Flowsheet Row Cardiac Rehab from 05/04/2022 in [facility] Cardiac and Pulmonary Rehab  Referring Provider Thakkar       Encounter Date: 05/23/2022  Check In:  Session Check In - 05/23/22 [place]       Check-In   Supervising physician immediately available to respond to emergencies See telemetry face sheet for immediately available ER MD    Location [facility]-Cardiac & Pulmonary Rehab    Staff Present Antionette Fairy, BS, Exercise Physiologist;Jessica [place], MA, RCEP, CCRP, CCET;Joseph [place], [place], RN, [place]    Virtual Visit No    Medication changes reported     No    Fall or balance concerns reported    No    Warm-up and Cool-down Performed on first and last piece of equipment    Resistance Training Performed Yes    VAD Patient? No    PAD/SET Patient? No      Pain Assessment   Currently in Pain? No/denies                Social History   Tobacco Use  Smoking Status Former   Packs/day: 0.25   Years: 20.00   Total pack years: 5.00   Types: Cigarettes   Quit date: 04/01/1974   Years since quitting: 48.1  Smokeless Tobacco Never    Goals Met:  Independence with exercise equipment Exercise tolerated well No report of concerns or symptoms today Strength training completed today  Goals Unmet:  Not Applicable  Comments: Pt able to follow exercise prescription today without complaint.  Will continue to monitor for progression.    Dr. Emily Filbert is Medical Director for [place].  Dr. Ottie Glazier is Medical Director for [facility] Pulmonary Rehabilitation.

## 2022-05-25 ENCOUNTER — Encounter: Payer: No Typology Code available for payment source | Admitting: *Deleted

## 2022-05-25 DIAGNOSIS — Z952 Presence of prosthetic heart valve: Secondary | ICD-10-CM

## 2022-05-25 NOTE — Progress Notes (Signed)
Daily Session Note  Patient Details  Name: Dustin Lara MRN: [id number] Date of Birth: Oct 17, 1940 Referring Provider:   Flowsheet Row Cardiac Rehab from 05/04/2022 in [facility] Cardiac and Pulmonary Rehab  Referring Provider Thakkar       Encounter Date: 05/25/2022  Check In:  Session Check In - 05/25/22 1610       Check-In   Supervising physician immediately available to respond to emergencies See telemetry face sheet for immediately available ER MD    Location [facility]-Cardiac & Pulmonary Rehab    Staff Present Renita Papa, RN BSN;Megan Tamala Julian, RN, Terie Purser, RCP,RRT,BSRT    Virtual Visit No    Medication changes reported     No    Fall or balance concerns reported    No    Warm-up and Cool-down Performed on first and last piece of equipment    Resistance Training Performed Yes    VAD Patient? No    PAD/SET Patient? No      Pain Assessment   Currently in Pain? No/denies                Social History   Tobacco Use  Smoking Status Former   Packs/day: 0.25   Years: 20.00   Total pack years: 5.00   Types: Cigarettes   Quit date: 04/01/1974   Years since quitting: 48.1  Smokeless Tobacco Never    Goals Met:  Independence with exercise equipment Exercise tolerated well No report of concerns or symptoms today Strength training completed today  Goals Unmet:  Not Applicable  Comments: Pt able to follow exercise prescription today without complaint.  Will continue to monitor for progression.    Dr. Emily Filbert is Medical Director for [place].  Dr. Ottie Glazier is Medical Director for [facility] Pulmonary Rehabilitation.

## 2022-05-26 ENCOUNTER — Encounter: Payer: No Typology Code available for payment source | Admitting: *Deleted

## 2022-05-26 DIAGNOSIS — Z952 Presence of prosthetic heart valve: Secondary | ICD-10-CM

## 2022-05-26 NOTE — Progress Notes (Signed)
Daily Session Note  Patient Details  Name: ISAIAH CIANCI MRN: [id number] Date of Birth: 1940/12/02 Referring Provider:   Flowsheet Row Cardiac Rehab from 05/04/2022 in [facility] Cardiac and Pulmonary Rehab  Referring Provider Thakkar       Encounter Date: 05/26/2022  Check In:  Session Check In - 05/26/22 1532       Check-In   Supervising physician immediately available to respond to emergencies See telemetry face sheet for immediately available ER MD    Location [facility]-Cardiac & Pulmonary Rehab    Staff Present Antionette Fairy, BS, Exercise Physiologist;Jessica [place], MA, RCEP, CCRP, Mindi Curling, RN, [place]    Virtual Visit No    Medication changes reported     No    Fall or balance concerns reported    No    Warm-up and Cool-down Performed on first and last piece of equipment    Resistance Training Performed Yes    VAD Patient? No    PAD/SET Patient? No      Pain Assessment   Currently in Pain? No/denies                Social History   Tobacco Use  Smoking Status Former   Packs/day: 0.25   Years: 20.00   Total pack years: 5.00   Types: Cigarettes   Quit date: 04/01/1974   Years since quitting: 48.1  Smokeless Tobacco Never    Goals Met:  Independence with exercise equipment Exercise tolerated well No report of concerns or symptoms today Strength training completed today  Goals Unmet:  Not Applicable  Comments: Pt able to follow exercise prescription today without complaint.  Will continue to monitor for progression.    Dr. Emily Filbert is Medical Director for [place].  Dr. Ottie Glazier is Medical Director for [facility] Pulmonary Rehabilitation.

## 2022-05-30 ENCOUNTER — Encounter: Payer: No Typology Code available for payment source | Admitting: *Deleted

## 2022-05-30 DIAGNOSIS — Z952 Presence of prosthetic heart valve: Secondary | ICD-10-CM

## 2022-05-30 NOTE — Progress Notes (Signed)
Daily Session Note  Patient Details  Name: Dustin Lara MRN: [id number] Date of Birth: 14-Oct-1940 Referring Provider:   Flowsheet Row Cardiac Rehab from 05/04/2022 in [facility] Cardiac and Pulmonary Rehab  Referring Provider Thakkar       Encounter Date: 05/30/2022  Check In:  Session Check In - 05/30/22 1608       Check-In   Supervising physician immediately available to respond to emergencies See telemetry face sheet for immediately available ER MD    Location [facility]-Cardiac & Pulmonary Rehab    Staff Present Justin Mend, RCP,RRT,BSRT;Roger Kettles Sherryll Burger, RN BSN;Noah Tickle, BS, Exercise Physiologist    Virtual Visit No    Medication changes reported     No    Fall or balance concerns reported    No    Warm-up and Cool-down Performed on first and last piece of equipment    Resistance Training Performed Yes    VAD Patient? No    PAD/SET Patient? No      Pain Assessment   Currently in Pain? No/denies                Social History   Tobacco Use  Smoking Status Former   Packs/day: 0.25   Years: 20.00   Total pack years: 5.00   Types: Cigarettes   Quit date: 04/01/1974   Years since quitting: 48.1  Smokeless Tobacco Never    Goals Met:  Independence with exercise equipment Exercise tolerated well No report of concerns or symptoms today Strength training completed today  Goals Unmet:  Not Applicable  Comments: Pt able to follow exercise prescription today without complaint.  Will continue to monitor for progression.    Dr. Emily Filbert is Medical Director for [place].  Dr. Ottie Glazier is Medical Director for [facility] Pulmonary Rehabilitation.

## 2022-06-01 ENCOUNTER — Encounter: Payer: Self-pay | Admitting: *Deleted

## 2022-06-01 ENCOUNTER — Encounter: Payer: No Typology Code available for payment source | Attending: Sports Medicine | Admitting: *Deleted

## 2022-06-01 DIAGNOSIS — Z952 Presence of prosthetic heart valve: Secondary | ICD-10-CM | POA: Insufficient documentation

## 2022-06-01 DIAGNOSIS — Z48812 Encounter for surgical aftercare following surgery on the circulatory system: Secondary | ICD-10-CM | POA: Insufficient documentation

## 2022-06-01 NOTE — Progress Notes (Signed)
Daily Session Note  Patient Details  Name: Dustin Lara MRN: [id number] Date of Birth: [birth year] Referring Provider:   Flowsheet Row Cardiac Rehab from 05/04/2022 in [facility] Cardiac and Pulmonary Rehab  Referring Provider Thakkar       Encounter Date: 06/01/2022  Check In:  Session Check In - 06/01/22 1540       Check-In   Supervising physician immediately available to respond to emergencies See telemetry face sheet for immediately available ER MD    Location [facility]-Cardiac & Pulmonary Rehab    Staff Present Renita Papa, RN BSN;Megan Tamala Julian, RN, Terie Purser, RCP,RRT,BSRT    Virtual Visit No    Medication changes reported     No    Fall or balance concerns reported    No    Warm-up and Cool-down Performed on first and last piece of equipment    Resistance Training Performed Yes    VAD Patient? No    PAD/SET Patient? No      Pain Assessment   Currently in Pain? No/denies                Social History   Tobacco Use  Smoking Status Former   Packs/day: 0.25   Years: 20.00   Total pack years: 5.00   Types: Cigarettes   Quit date: 04/01/1974   Years since quitting: 48.2  Smokeless Tobacco Never    Goals Met:  Independence with exercise equipment Exercise tolerated well No report of concerns or symptoms today Strength training completed today  Goals Unmet:  Not Applicable  Comments: Pt able to follow exercise prescription today without complaint.  Will continue to monitor for progression.    Dr. Emily Filbert is Medical Director for [place].  Dr. Ottie Glazier is Medical Director for [facility] Pulmonary Rehabilitation.

## 2022-06-01 NOTE — Progress Notes (Signed)
Cardiac Individual Treatment Plan  Patient Details  Name: Dustin Lara MRN: [id number] Date of Birth: 02-25-1941 Referring Provider:   Flowsheet Row Cardiac Rehab from 05/04/2022 in [facility] Cardiac and Pulmonary Rehab  Referring Provider Thakkar       Initial Encounter Date:  Flowsheet Row Cardiac Rehab from 05/04/2022 in [facility] Cardiac and Pulmonary Rehab  Date 05/04/22       Visit Diagnosis: S/P TAVR (transcatheter aortic valve replacement)  Patient's Home Medications on Admission:  Current Outpatient Medications:    acetaminophen (TYLENOL) 325 MG tablet, TAKE TWO TABLETS BY MOUTH THREE TIMES A DAY AS NEEDED, Disp: , Rfl:    albuterol (VENTOLIN HFA) 108 (90 Base) MCG/ACT inhaler, INHALE 2 PUFFS BY ORAL INHALATION 3 TIMES A DAY AS NEEDED FOR BREATHING. BE SURE TO Emerson MOUTHPIECE WITH WARM WATER ONCE A WEEK, Disp: , Rfl:    Alogliptin Benzoate 6.25 MG TABS, TAKE ONE TABLET BY MOUTH EVERY MORNING FOR DIABETES, Disp: , Rfl:    amLODipine (NORVASC) 5 MG tablet, TAKE ONE-HALF TABLET BY MOUTH EVERY DAY ESSENTIAL HYPERTENSION FOR BLOOD PRESSURE, Disp: , Rfl:    ascorbic acid (VITAMIN C) 100 MG tablet, TAKE  BY MOUTH, Disp: , Rfl:    atorvastatin (LIPITOR) 20 MG tablet, TAKE ONE TABLET (20MG) BY MOUTH AT BEDTIME, Disp: , Rfl:    Calcium Carb-Cholecalciferol 500-10 MG-MCG TABS, Take 1 tablet by mouth daily., Disp: , Rfl:    Calcium Carbonate-Vitamin D 250-3.125 MG-MCG TABS, TAKE  BY MOUTH, Disp: , Rfl:    chlorthalidone (HYGROTON) 25 MG tablet, TAKE ONE-HALF TABLET BY MOUTH EVERY DAY FOR BLOOD PRESSURE *IMPORTANT STOP TAKING  HYDROCHLOROTHIZAIDE, Disp: , Rfl:    ferrous gluconate (FERGON) 324 MG tablet, Take 1 tablet by mouth every other day., Disp: , Rfl:    ferrous sulfate 325 (65 FE) MG EC tablet, Take by mouth., Disp: , Rfl:    finasteride (PROSCAR) 5 MG tablet, Take 1 tablet by mouth daily., Disp: , Rfl:    fluticasone-salmeterol (ADVAIR) 100-50 MCG/ACT AEPB, INHALE 1 PUFF BY MOUTH EVERY  12 HOURS, Disp: , Rfl:    glipiZIDE (GLUCOTROL) 10 MG tablet, TAKE ONE TABLET BY MOUTH TWO TIMES A DAY . DOSE INCREASE, Disp: , Rfl:    glucose blood (PRECISION QID TEST) test strip, Use 1 strip twice a week, Disp: , Rfl:    ketoconazole (NIZORAL) 2 % shampoo, SHAMPOO AS DIRECTED TOPICALLY TUESDAY,THURSDAY,SATURDAY AS NEEDED, Disp: , Rfl:    losartan (COZAAR) 50 MG tablet, TAKE ONE TABLET BY MOUTH EVERY DAY ESSENTIAL HYPERTENSION, Disp: , Rfl:    polyethylene glycol (MIRALAX / GLYCOLAX) 17 g packet, Take by mouth., Disp: , Rfl:    senna-docusate (SENOKOT-S) 8.6-50 MG tablet, Take by mouth., Disp: , Rfl:    tamsulosin (FLOMAX) 0.4 MG CAPS capsule, Take 1 capsule by mouth every evening., Disp: , Rfl:    Tiotropium Bromide Monohydrate 2.5 MCG/ACT AERS, INHALE 2 INHALATIONS BY ORAL INHALATION EVERY DAY, Disp: , Rfl:    traZODone (DESYREL) 50 MG tablet, TAKE ONE AND ONE-HALF TABLETS BY MOUTH AT BEDTIME FOR SLEEP AND MOOD, Disp: , Rfl:   Past Medical History: Past Medical History:  Diagnosis Date   Hypertension     Tobacco Use: Social History   Tobacco Use  Smoking Status Former   Packs/day: 0.25   Years: 20.00   Total pack years: 5.00   Types: Cigarettes   Quit date: 04/01/1974   Years since quitting: 48.2  Smokeless Tobacco Never  Labs: Review Flowsheet        No data to display           Exercise Target Goals: Exercise Program Goal: Individual exercise prescription set using results from initial 6 min walk test and THRR while considering  patient's activity barriers and safety.   Exercise Prescription Goal: Initial exercise prescription builds to 30-45 minutes a day of aerobic activity, 2-3 days per week.  Home exercise guidelines will be given to patient during program as part of exercise prescription that the participant will acknowledge.   Education: Aerobic Exercise: - Group verbal and visual presentation on the components of exercise prescription. Introduces  F.I.T.T principle from ACSM for exercise prescriptions.  Reviews F.I.T.T. principles of aerobic exercise including progression. Written material given at graduation. Flowsheet Row Cardiac Rehab from 05/11/2022 in [facility] [place] Cardiac and Pulmonary Rehab  Education need identified 05/04/22       Education: Resistance Exercise: - Group verbal and visual presentation on the components of exercise prescription. Introduces F.I.T.T principle from ACSM for exercise prescriptions  Reviews F.I.T.T. principles of resistance exercise including progression. Written material given at graduation.    Education: Exercise & Equipment Safety: - Individual verbal instruction and demonstration of equipment use and safety with use of the equipment. Flowsheet Row Cardiac Rehab from 05/11/2022 in [facility] Cardiac and Pulmonary Rehab  Date 04/25/22  Educator [facility]  Instruction Review Code 1- Verbalizes Understanding       Education: Exercise Physiology & General Exercise Guidelines: - Group verbal and written instruction with models to review the exercise physiology of the cardiovascular system and associated critical values. Provides general exercise guidelines with specific guidelines to those with heart or lung disease.    Education: Flexibility, Balance, Mind/Body Relaxation: - Group verbal and visual presentation with interactive activity on the components of exercise prescription. Introduces F.I.T.T principle from ACSM for exercise prescriptions. Reviews F.I.T.T. principles of flexibility and balance exercise training including progression. Also discusses the mind body connection.  Reviews various relaxation techniques to help reduce and manage stress (i.e. Deep breathing, progressive muscle relaxation, and visualization). Balance handout provided to take home. Written material given at graduation.   Activity Barriers & Risk Stratification:  Activity Barriers & Cardiac Risk Stratification - 05/04/22 1505        Activity Barriers & Cardiac Risk Stratification   Activity Barriers Muscular Weakness;Deconditioning;Shortness of Breath    Cardiac Risk Stratification High             6 Minute Walk:  6 Minute Walk     Row Name 05/04/22 1504         6 Minute Walk   Phase Initial     Distance 880 feet     Walk Time 6 minutes     # of Rest Breaks 0     MPH 1.67     METS 1.55     RPE 11     Perceived Dyspnea  1     VO2 Peak 5.43     Symptoms Yes (comment)     Comments Chest Tightness, SOB     Resting HR 64 bpm     Resting BP 128/56     Resting Oxygen Saturation  97 %     Exercise Oxygen Saturation  during 6 min walk 99 %     Max Ex. HR 103 bpm     Max Ex. BP 148/62     2 Minute Post BP 136/66  Oxygen Initial Assessment:   Oxygen Re-Evaluation:   Oxygen Discharge (Final Oxygen Re-Evaluation):   Initial Exercise Prescription:  Initial Exercise Prescription - 05/04/22 1500       Date of Initial Exercise RX and Referring Provider   Date 05/04/22    Referring Provider Thakkar      Oxygen   Maintain Oxygen Saturation 88% or higher      Treadmill   MPH 1.8    Grade 0.5    Minutes 15    METs 2.5      Recumbant Bike   Level 1    RPM 50    Watts 12    Minutes 15    METs 1.55      NuStep   Level 2    SPM 80    Minutes 15    METs 1.55      T5 Nustep   Level 1    SPM 80    Minutes 15    METs 1.55      Prescription Details   Frequency (times per week) 3    Duration Progress to 30 minutes of continuous aerobic without signs/symptoms of physical distress      Intensity   THRR 40-80% of Max Heartrate 93-123    Ratings of Perceived Exertion 11-13    Perceived Dyspnea 0-4      Progression   Progression Continue to progress workloads to maintain intensity without signs/symptoms of physical distress.      Resistance Training   Training Prescription Yes    Weight 3 lb    Reps 10-15             Perform Capillary Blood Glucose checks as  needed.  Exercise Prescription Changes:   Exercise Prescription Changes     Row Name 05/04/22 1500 05/16/22 1300 05/26/22 1500 05/31/22 1400       Response to Exercise   Blood Pressure (Admit) 128/56 104/64 -- 134/66    Blood Pressure (Exercise) 148/62 124/64 -- 140/77    Blood Pressure (Exit) 136/66 120/60 -- 124/56    Heart Rate (Admit) 64 bpm 8 bpm -- 76 bpm    Heart Rate (Exercise) 103 bpm 133 bpm -- 147 bpm    Heart Rate (Exit) 59 bpm 95 bpm -- 111 bpm    Oxygen Saturation (Admit) 97 % -- -- --    Oxygen Saturation (Exercise) 99 % -- -- --    Rating of Perceived Exertion (Exercise) 11 15 -- 13    Perceived Dyspnea (Exercise) 1 -- -- --    Symptoms chest tightness, SOB none -- none    Comments 6MWT Results 3rd full day oof exercise -- --    Duration -- Progress to 30 minutes of  aerobic without signs/symptoms of physical distress -- Continue with 30 min of aerobic exercise without signs/symptoms of physical distress.    Intensity -- THRR unchanged -- THRR unchanged      Progression   Progression -- Continue to progress workloads to maintain intensity without signs/symptoms of physical distress. -- Continue to progress workloads to maintain intensity without signs/symptoms of physical distress.    Average METs -- 2.46 -- 3.12      Resistance Training   Training Prescription -- Yes -- Yes    Weight -- 3 lb -- 3 lb    Reps -- 10-15 -- 10-15      Interval Training   Interval Training -- No -- No      Treadmill  MPH -- 2 -- 2.5    Grade -- 0.5 -- 3    Minutes -- 15 -- 15    METs -- 2.67 -- 3.95      Recumbant Bike   Level -- 2 -- 3    Watts -- 25 -- 23    Minutes -- 15 -- 15    METs -- 2.9 -- --      NuStep   Level -- 3 -- 3    Minutes -- 15 -- 15    METs -- 2.4 -- 3      Home Exercise Plan   Plans to continue exercise at -- -- Home (comment)  Walking and bands at home; considering joining [employer] Home (comment)  Walking and bands at home; considering  joining [employer]    Frequency -- -- Add 2 additional days to program exercise sessions. Add 2 additional days to program exercise sessions.    Initial Home Exercises Provided -- -- 05/26/22 05/26/22      Oxygen   Maintain Oxygen Saturation -- 88% or higher 88% or higher 88% or higher             Exercise Comments:   Exercise Comments     Row Name 05/05/22 1607           Exercise Comments First full day of exercise!  Patient was oriented to gym and equipment including functions, settings, policies, and procedures.  Patient's individual exercise prescription and treatment plan were reviewed.  All starting workloads were established based on the results of the 6 minute walk test done at initial orientation visit.  The plan for exercise progression was also introduced and progression will be customized based on patient's performance and goals.                Exercise Goals and Review:   Exercise Goals     Row Name 05/04/22 1517             Exercise Goals   Increase Physical Activity Yes       Intervention Provide advice, education, support and counseling about physical activity/exercise needs.;Develop an individualized exercise prescription for aerobic and resistive training based on initial evaluation findings, risk stratification, comorbidities and participant's personal goals.       Expected Outcomes Short Term: Attend rehab on a regular basis to increase amount of physical activity.;Long Term: Add in home exercise to make exercise part of routine and to increase amount of physical activity.;Long Term: Exercising regularly at least 3-5 days a week.       Increase Strength and Stamina Yes       Intervention Develop an individualized exercise prescription for aerobic and resistive training based on initial evaluation findings, risk stratification, comorbidities and participant's personal goals.;Provide advice, education, support and counseling about physical  activity/exercise needs.       Expected Outcomes Short Term: Increase workloads from initial exercise prescription for resistance, speed, and METs.;Short Term: Perform resistance training exercises routinely during rehab and add in resistance training at home;Long Term: Improve cardiorespiratory fitness, muscular endurance and strength as measured by increased METs and functional capacity (6MWT)       Able to understand and use rate of perceived exertion (RPE) scale Yes       Intervention Provide education and explanation on how to use RPE scale       Expected Outcomes Short Term: Able to use RPE daily in rehab to express subjective intensity level;Long Term:  Able to use RPE to guide intensity level when exercising independently       Able to understand and use Dyspnea scale Yes       Intervention Provide education and explanation on how to use Dyspnea scale       Expected Outcomes Long Term: Able to use Dyspnea scale to guide intensity level when exercising independently;Short Term: Able to use Dyspnea scale daily in rehab to express subjective sense of shortness of breath during exertion       Knowledge and understanding of Target Heart Rate Range (THRR) Yes       Intervention Provide education and explanation of THRR including how the numbers were predicted and where they are located for reference       Expected Outcomes Short Term: Able to state/look up THRR;Long Term: Able to use THRR to govern intensity when exercising independently;Short Term: Able to use daily as guideline for intensity in rehab       Able to check pulse independently Yes       Intervention Provide education and demonstration on how to check pulse in carotid and radial arteries.;Review the importance of being able to check your own pulse for safety during independent exercise       Expected Outcomes Short Term: Able to explain why pulse checking is important during independent exercise;Long Term: Able to check pulse  independently and accurately       Understanding of Exercise Prescription Yes       Intervention Provide education, explanation, and written materials on patient's individual exercise prescription       Expected Outcomes Short Term: Able to explain program exercise prescription;Long Term: Able to explain home exercise prescription to exercise independently                Exercise Goals Re-Evaluation :  Exercise Goals Re-Evaluation     Row Name 05/05/22 1607 05/16/22 1310 05/26/22 1600 05/31/22 1413       Exercise Goal Re-Evaluation   Exercise Goals Review Knowledge and understanding of Target Heart Rate Range (THRR);Able to understand and use rate of perceived exertion (RPE) scale;Understanding of Exercise Prescription;Able to understand and use Dyspnea scale Increase Physical Activity;Increase Strength and Stamina;Understanding of Exercise Prescription Increase Physical Activity;Increase Strength and Stamina;Understanding of Exercise Prescription Increase Physical Activity;Increase Strength and Stamina;Understanding of Exercise Prescription    Comments Reviewed RPE and dyspnea scales, THR and program prescription with pt today.  Pt voiced understanding and was given a copy of goals to take home. Vipul is off to a good start with rehab for the first couple of sessions he has been here. He is able to do his initial exercise prescription and is already up to level 3 on the T4 Nustep. We will contiue to monitor as he progresses. Reviewed home exercise with pt today.  Pt plans to walk and use resistance bands at home for exercise. He is also considering joining [employer]. Reviewed THR, pulse, RPE, sign and symptoms, pulse oximetery and when to call 911 or MD.  Also discussed weather considerations and indoor options.  Pt voiced understanding. Taevin continues to do well in rehab. He recently improved his overall average MET level to 3.12 METs. He also increased his workload on the treadmill to 2.5  mph and an incline of 3%. He has tolerated 3 lb for hand weights as well. We will continue to monitor his progress in the program.    Expected Outcomes Short: Use RPE daily to regulate intensity. Long:  Follow program prescription in THR. Short: Continue curent exercise prescription and attend rehab Long: Improve overall strength and stamina Short: Begin to walk more on days away from rehab. Long: Continue to increase strength and stamina. Short: Continue to increase workloads as tolerated. Long: Continue to increase strength and stamina.             Discharge Exercise Prescription (Final Exercise Prescription Changes):  Exercise Prescription Changes - 05/31/22 1400       Response to Exercise   Blood Pressure (Admit) 134/66    Blood Pressure (Exercise) 140/77    Blood Pressure (Exit) 124/56    Heart Rate (Admit) 76 bpm    Heart Rate (Exercise) 147 bpm    Heart Rate (Exit) 111 bpm    Rating of Perceived Exertion (Exercise) 13    Symptoms none    Duration Continue with 30 min of aerobic exercise without signs/symptoms of physical distress.    Intensity THRR unchanged      Progression   Progression Continue to progress workloads to maintain intensity without signs/symptoms of physical distress.    Average METs 3.12      Resistance Training   Training Prescription Yes    Weight 3 lb    Reps 10-15      Interval Training   Interval Training No      Treadmill   MPH 2.5    Grade 3    Minutes 15    METs 3.95      Recumbant Bike   Level 3    Watts 23    Minutes 15      NuStep   Level 3    Minutes 15    METs 3      Home Exercise Plan   Plans to continue exercise at Home (comment)   Walking and bands at home; considering joining [employer]   Frequency Add 2 additional days to program exercise sessions.    Initial Home Exercises Provided 05/26/22      Oxygen   Maintain Oxygen Saturation 88% or higher             Nutrition:  Target Goals: Understanding of  nutrition guidelines, daily intake of sodium <15110m, cholesterol <2044m calories 30% from fat and 7% or less from saturated fats, daily to have 5 or more servings of fruits and vegetables.  Education: All About Nutrition: -Group instruction provided by verbal, written material, interactive activities, discussions, models, and posters to present general guidelines for heart healthy nutrition including fat, fiber, MyPlate, the role of sodium in heart healthy nutrition, utilization of the nutrition label, and utilization of this knowledge for meal planning. Follow up email sent as well. Written material given at graduation. [facility]  Education need identified 05/04/22       Biometrics:  Pre Biometrics - 05/04/22 1517       Pre Biometrics   Height 5' 6.4" (1.687 m)    Weight 184 lb 6.4 oz (83.6 kg)    Waist Circumference 41 inches    Hip Circumference 41.5 inches    Waist to Hip Ratio 0.99 %    BMI (Calculated) 29.39    Single Leg Stand 5.45 seconds   L             Nutrition Therapy Plan and Nutrition Goals:  Nutrition Therapy & Goals - 05/04/22 1445       Nutrition Therapy   Diet Heart healthy,  low Na, T2DM MNT    Drug/Food Interactions Statins/Certain Fruits    Protein (specify units) 90-95g    Fiber 25 grams    Whole Grain Foods 3 servings    Saturated Fats 14 max. grams    Fruits and Vegetables 8 servings/day    Sodium 2 grams      Personal Nutrition Goals   Nutrition Goal ST: add in protein rich snack, add non-starchy vegetables and fruit to meals LT: increase fruit and vegetable intake to at least 5 per day, limit sodium <2g/day, meet energy and protein needs    Comments He chooses whole wheat bread instead of white bread. Food recall: B: bowl of cereal (frosted wheat as well as another cereal with pecans and freeze dried strawberries, 2% milk) or bacon/sausage and eggs (tears up whole wheat bread  to eat it). Sometimes won't eat anything in the morning. S: sometimes pack of nabs D: tonight he will have hot dogs with cheese (1x/week), homemade tacos, sandwich (whole wheat bread), [employer]. He goes out to eat 2x/week. Drinks: diet soda (1-3) or flavored water, water. He reports his BG has been running higher than normal. he reports his breathing has improved overall since being in the hospital. Discussed general heart healthy eating and reviewed T2DM MNT as well as pulmnary MNT. Encouraged to start with adding in additional non-starchy vegetables and to add in a snack mid-day with protein as he is not eating lunch most days; boiled egg, greek yogurt, peanut butter - he reports enjoying peanut butter and banana sandwiches so he may have that as well. So that he doesn't need to cook additional food, suggested he could use frozen or canned fruits/vegetables; discussed sodium and sugar considerations with canned foods.      Intervention Plan   Intervention Prescribe, educate and counsel regarding individualized specific dietary modifications aiming towards targeted core components such as weight, hypertension, lipid management, diabetes, heart failure and other comorbidities.;Nutrition handout(s) given to patient.    Expected Outcomes Short Term Goal: Understand basic principles of dietary content, such as calories, fat, sodium, cholesterol and nutrients.;Short Term Goal: A plan has been developed with personal nutrition goals set during dietitian appointment.;Long Term Goal: Adherence to prescribed nutrition plan.             Nutrition Assessments:  MEDIFICTS Score Key: ?70 Need to make dietary changes  40-70 Heart Healthy Diet ? 40 Therapeutic Level Cholesterol Diet  Flowsheet Row Cardiac Rehab from 05/04/2022 in [facility] Cardiac and Pulmonary Rehab  Picture Your Plate Total Score on Admission 53      Picture Your Plate Scores: <16 Unhealthy dietary pattern with much room for  improvement. 41-50 Dietary pattern unlikely to meet recommendations for good health and room for improvement. 51-60 More healthful dietary pattern, with some room for improvement.  >60 Healthy dietary pattern, although there may be some specific behaviors that could be improved.    Nutrition Goals Re-Evaluation:   Nutrition Goals Discharge (Final Nutrition Goals Re-Evaluation):   Psychosocial: Target Goals: Acknowledge presence or absence of significant depression and/or stress, maximize coping skills, provide positive support system. Participant is able to verbalize types and ability to use techniques and skills needed for reducing stress and depression.   Education: Stress, Anxiety, and Depression - Group verbal and visual presentation to define topics covered.  Reviews how body is impacted by stress, anxiety, and depression.  Also discusses healthy ways to reduce stress and to treat/manage anxiety and depression.  Written material given  at graduation. Flowsheet Row Cardiac Rehab from 05/11/2022 in [facility] Cardiac and Pulmonary Rehab  Education need identified 05/04/22       Education: Sleep Hygiene -Provides group verbal and written instruction about how sleep can affect your health.  Define sleep hygiene, discuss sleep cycles and impact of sleep habits. Review good sleep hygiene tips.    Initial Review & Psychosocial Screening:  Initial Psych Review & Screening - 04/25/22 1036       Initial Review   Current issues with None Identified      Family Dynamics   Good Support System? Yes    Comments Lynam daughter and step son are good support systems for him. He other Arletha Pili looks after him as well. He lives by himself, his wife passed away four years ago.      Barriers   Psychosocial barriers to participate in program There are no identifiable barriers or psychosocial needs.;The patient should benefit from training in stress management and relaxation.      Screening  Interventions   Interventions Encouraged to exercise;To provide support and resources with identified psychosocial needs;Provide feedback about the scores to participant    Expected Outcomes Short Term goal: Utilizing psychosocial counselor, staff and physician to assist with identification of specific Stressors or current issues interfering with healing process. Setting desired goal for each stressor or current issue identified.;Long Term Goal: Stressors or current issues are controlled or eliminated.;Short Term goal: Identification and review with participant of any Quality of Life or Depression concerns found by scoring the questionnaire.;Long Term goal: The participant improves quality of Life and PHQ9 Scores as seen by post scores and/or verbalization of changes             Quality of Life Scores:   Quality of Life - 05/04/22 1459       Quality of Life   Select Quality of Life      Quality of Life Scores   Health/Function Pre 24.36 %    Socioeconomic Pre 25.86 %    Psych/Spiritual Pre 29.14 %    Family Pre 22.88 %    GLOBAL Pre 25.55 %            Scores of 19 and below usually indicate a poorer quality of life in these areas.  A difference of  2-3 points is a clinically meaningful difference.  A difference of 2-3 points in the total score of the Quality of Life Index has been associated with significant improvement in overall quality of life, self-image, physical symptoms, and general health in studies assessing change in quality of life.  PHQ-9: Review Flowsheet       05/04/2022  Depression screen PHQ 2/9  Decreased Interest 0  Down, Depressed, Hopeless 1  PHQ - 2 Score 1  Altered sleeping 0  Tired, decreased energy 1  Change in appetite 0  Feeling bad or failure about yourself  1  Trouble concentrating 0  Moving slowly or fidgety/restless 2  Suicidal thoughts 0  PHQ-9 Score 5  Difficult doing work/chores Not difficult at all   Interpretation of Total Score   Total Score Depression Severity:  1-4 = Minimal depression, 5-9 = Mild depression, 10-14 = Moderate depression, 15-19 = Moderately severe depression, 20-27 = Severe depression   Psychosocial Evaluation and Intervention:  Psychosocial Evaluation - 04/25/22 1038       Psychosocial Evaluation & Interventions   Interventions Encouraged to exercise with the program and follow exercise prescription;Stress management education;Relaxation education  Comments [place] daughter and step son are good support systems for him. He other Arletha Pili looks after him as well. He lives by himself, his wife passed away four years ago.    Expected Outcomes Short: Start [facility] to help with mood. Long: Maintain a healthy mental state    Continue Psychosocial Services  Follow up required by staff             Psychosocial Re-Evaluation:   Psychosocial Discharge (Final Psychosocial Re-Evaluation):   Vocational Rehabilitation: Provide vocational rehab assistance to qualifying candidates.   Vocational Rehab Evaluation & Intervention:   Education: Education Goals: Education classes will be provided on a variety of topics geared toward better understanding of heart health and risk factor modification. Participant will state understanding/return demonstration of topics presented as noted by education test scores.  Learning Barriers/Preferences:  Learning Barriers/Preferences - 04/25/22 1032       Learning Barriers/Preferences   Learning Barriers None    Learning Preferences None             General Cardiac Education Topics:  AED/CPR: - Group verbal and written instruction with the use of models to demonstrate the basic use of the AED with the basic ABC's of resuscitation.   Anatomy and Cardiac Procedures: - Group verbal and visual presentation and models provide information about basic cardiac anatomy and function. Reviews the testing methods done to diagnose heart disease and the  outcomes of the test results. Describes the treatment choices: Medical Management, Angioplasty, or Coronary Bypass Surgery for treating various heart conditions including Myocardial Infarction, Angina, Valve Disease, and Cardiac Arrhythmias.  Written material given at graduation. Flowsheet Row Cardiac Rehab from 05/11/2022 in [facility] Cardiac and Pulmonary Rehab  Education need identified 05/04/22       Medication Safety: - Group verbal and visual instruction to review commonly prescribed medications for heart and lung disease. Reviews the medication, class of the drug, and side effects. Includes the steps to properly store meds and maintain the prescription regimen.  Written material given at graduation.   Intimacy: - Group verbal instruction through game format to discuss how heart and lung disease can affect sexual intimacy. Written material given at graduation..   Know Your Numbers and Heart Failure: - Group verbal and visual instruction to discuss disease risk factors for cardiac and pulmonary disease and treatment options.  Reviews associated critical values for Overweight/Obesity, Hypertension, Cholesterol, and Diabetes.  Discusses basics of heart failure: signs/symptoms and treatments.  Introduces Heart Failure Zone chart for action plan for heart failure.  Written material given at graduation. Flowsheet Row Cardiac Rehab from 05/11/2022 in [facility] Cardiac and Pulmonary Rehab  Date 05/11/22  Educator SB  Instruction Review Code 5- Refused Teaching       Infection Prevention: - Provides verbal and written material to individual with discussion of infection control including proper hand washing and proper equipment cleaning during exercise session. Flowsheet Row Cardiac Rehab from 05/11/2022 in [facility] Cardiac and Pulmonary Rehab  Date 04/25/22  Educator [facility]  Instruction Review Code 1- Verbalizes Understanding       Falls Prevention: - Provides verbal and written material to individual  with discussion of falls prevention and safety. Flowsheet Row Cardiac Rehab from 05/11/2022 in [facility] Cardiac and Pulmonary Rehab  Date 04/25/22  Educator [facility]  Instruction Review Code 1- Verbalizes Understanding       Other: -Provides group and verbal instruction on various topics (see comments)   Knowledge Questionnaire Score:  Knowledge Questionnaire Score -  05/04/22 1501       Knowledge Questionnaire Score   Pre Score 18/26             Core Components/Risk Factors/Patient Goals at Admission:  Personal Goals and Risk Factors at Admission - 05/04/22 1501       Core Components/Risk Factors/Patient Goals on Admission    Weight Management Yes;Weight Maintenance    Intervention Weight Management: Develop a combined nutrition and exercise program designed to reach desired caloric intake, while maintaining appropriate intake of nutrient and fiber, sodium and fats, and appropriate energy expenditure required for the weight goal.;Weight Management: Provide education and appropriate resources to help participant work on and attain dietary goals.;Weight Management/Obesity: Establish reasonable short term and long term weight goals.    Admit Weight 184 lb 6.4 oz (83.6 kg)    Goal Weight: Short Term 180 lb (81.6 kg)    Goal Weight: Long Term 175 lb (79.4 kg)    Expected Outcomes Short Term: Continue to assess and modify interventions until short term weight is achieved;Long Term: Adherence to nutrition and physical activity/exercise program aimed toward attainment of established weight goal;Weight Maintenance: Understanding of the daily nutrition guidelines, which includes 25-35% calories from fat, 7% or less cal from saturated fats, less than 21m cholesterol, less than 1.5gm of sodium, & 5 or more servings of fruits and vegetables daily;Understanding recommendations for meals to include 15-35% energy as protein, 25-35% energy from fat, 35-60% energy from carbohydrates, less than 2068mof  dietary cholesterol, 20-35 gm of total fiber daily;Understanding of distribution of calorie intake throughout the day with the consumption of 4-5 meals/snacks    Diabetes Yes    Intervention Provide education about signs/symptoms and action to take for hypo/hyperglycemia.;Provide education about proper nutrition, including hydration, and aerobic/resistive exercise prescription along with prescribed medications to achieve blood glucose in normal ranges: Fasting glucose 65-99 mg/dL    Expected Outcomes Short Term: Participant verbalizes understanding of the signs/symptoms and immediate care of hyper/hypoglycemia, proper foot care and importance of medication, aerobic/resistive exercise and nutrition plan for blood glucose control.;Long Term: Attainment of HbA1C < 7%.    Hypertension Yes    Intervention Provide education on lifestyle modifcations including regular physical activity/exercise, weight management, moderate sodium restriction and increased consumption of fresh fruit, vegetables, and low fat dairy, alcohol moderation, and smoking cessation.;Monitor prescription use compliance.    Expected Outcomes Short Term: Continued assessment and intervention until BP is < 140/9079mG in hypertensive participants. < 130/98m58m in hypertensive participants with diabetes, heart failure or chronic kidney disease.;Long Term: Maintenance of blood pressure at goal levels.    Lipids Yes    Intervention Provide education and support for participant on nutrition & aerobic/resistive exercise along with prescribed medications to achieve LDL <70mg72mL >40mg.15mExpected Outcomes Short Term: Participant states understanding of desired cholesterol values and is compliant with medications prescribed. Participant is following exercise prescription and nutrition guidelines.;Long Term: Cholesterol controlled with medications as prescribed, with individualized exercise RX and with personalized nutrition plan. Value goals: LDL <  70mg, 31m> 40 mg.             Education:Diabetes - Individual verbal and written instruction to review signs/symptoms of diabetes, desired ranges of glucose level fasting, after meals and with exercise. Acknowledge that pre and post exercise glucose checks will be done for 3 sessions at entry of program. [place]  Date 04/25/22  Educator  [facility]  Instruction Review Code 1- Verbalizes Understanding       Core Components/Risk Factors/Patient Goals Review:    Core Components/Risk Factors/Patient Goals at Discharge (Final Review):    ITP Comments:  ITP Comments     Row Name 04/25/22 1035 05/04/22 1459 05/05/22 1607 06/01/22 0959     ITP Comments Virtual Visit completed. Patient informed on EP and RD appointment and 6 Minute walk test. Patient also informed of patient health questionnaires on My Chart. Patient Verbalizes understanding. Visit diagnosis can be found in [facility] 02/14/2022. Completed 6MWT and gym orientation. Initial ITP created and sent for review to Dr. Emily Filbert, Medical Director. First full day of exercise!  Patient was oriented to gym and equipment including functions, settings, policies, and procedures.  Patient's individual exercise prescription and treatment plan were reviewed.  All starting workloads were established based on the results of the 6 minute walk test done at initial orientation visit.  The plan for exercise progression was also introduced and progression will be customized based on patient's performance and goals. 30 Day review completed. Medical Director ITP review done, changes made as directed, and signed approval by Medical Director.   NEW TO PROGRAM             Comments:

## 2022-06-02 ENCOUNTER — Encounter: Payer: No Typology Code available for payment source | Admitting: *Deleted

## 2022-06-02 DIAGNOSIS — Z952 Presence of prosthetic heart valve: Secondary | ICD-10-CM | POA: Diagnosis not present

## 2022-06-02 NOTE — Progress Notes (Signed)
Daily Session Note  Patient Details  Name: AFSHIN CHRYSTAL MRN: [id number] Date of Birth: [birth year] Referring Provider:   Flowsheet Row Cardiac Rehab from 05/04/2022 in [facility] Cardiac and Pulmonary Rehab  Referring Provider Thakkar       Encounter Date: 06/02/2022  Check In:  Session Check In - 06/02/22 1550       Check-In   Supervising physician immediately available to respond to emergencies See telemetry face sheet for immediately available ER MD    Location [facility]-Cardiac & Pulmonary Rehab    Staff Present Renita Papa, RN BSN;Joseph Tessie Fass, RCP,RRT,BSRT;Noah [place], [place], Exercise Physiologist    Virtual Visit No    Medication changes reported     No    Fall or balance concerns reported    No    Warm-up and Cool-down Performed on first and last piece of equipment    Resistance Training Performed Yes    VAD Patient? No    PAD/SET Patient? No      Pain Assessment   Currently in Pain? No/denies                Social History   Tobacco Use  Smoking Status Former   Packs/day: 0.25   Years: 20.00   Total pack years: 5.00   Types: Cigarettes   Quit date: 04/01/1974   Years since quitting: 48.2  Smokeless Tobacco Never    Goals Met:  Independence with exercise equipment Exercise tolerated well No report of concerns or symptoms today Strength training completed today  Goals Unmet:  Not Applicable  Comments: Pt able to follow exercise prescription today without complaint.  Will continue to monitor for progression.    Dr. Emily Filbert is Medical Director for [place].  Dr. Ottie Glazier is Medical Director for [facility] Pulmonary Rehabilitation.

## 2022-06-06 ENCOUNTER — Encounter: Payer: No Typology Code available for payment source | Admitting: *Deleted

## 2022-06-06 DIAGNOSIS — Z952 Presence of prosthetic heart valve: Secondary | ICD-10-CM

## 2022-06-06 NOTE — Progress Notes (Signed)
Daily Session Note  Patient Details  Name: Dustin Lara MRN: [id number] Date of Birth: 1940-08-12 Referring Provider:   Flowsheet Row Cardiac Rehab from 05/04/2022 in [facility] Cardiac and Pulmonary Rehab  Referring Provider Thakkar       Encounter Date: 06/06/2022  Check In:  Session Check In - 06/06/22 1620       Check-In   Supervising physician immediately available to respond to emergencies See telemetry face sheet for immediately available ER MD    Location [facility]-Cardiac & Pulmonary Rehab    Staff Present Antionette Fairy, BS, Exercise Physiologist;Meredith Sherryll Burger, RN Odelia Gage, RN, ADN    Virtual Visit No    Medication changes reported     No    Fall or balance concerns reported    No    Warm-up and Cool-down Performed on first and last piece of equipment    Resistance Training Performed Yes    PAD/SET Patient? No      Pain Assessment   Currently in Pain? No/denies                Social History   Tobacco Use  Smoking Status Former   Packs/day: 0.25   Years: 20.00   Total pack years: 5.00   Types: Cigarettes   Quit date: 04/01/1974   Years since quitting: 48.2  Smokeless Tobacco Never    Goals Met:  Independence with exercise equipment Exercise tolerated well No report of concerns or symptoms today Strength training completed today  Goals Unmet:  Not Applicable  Comments: Pt able to follow exercise prescription today without complaint.  Will continue to monitor for progression.    Dr. Emily Filbert is Medical Director for [place].  Dr. Ottie Glazier is Medical Director for [facility] Pulmonary Rehabilitation.

## 2022-06-08 ENCOUNTER — Encounter: Payer: No Typology Code available for payment source | Admitting: *Deleted

## 2022-06-08 DIAGNOSIS — Z952 Presence of prosthetic heart valve: Secondary | ICD-10-CM | POA: Diagnosis not present

## 2022-06-08 NOTE — Progress Notes (Signed)
Daily Session Note  Patient Details  Name: Dustin Lara MRN: [id number] Date of Birth: 03/09/1941 Referring Provider:   Flowsheet Row Cardiac Rehab from 05/04/2022 in [facility] Cardiac and Pulmonary Rehab  Referring Provider Thakkar       Encounter Date: 06/08/2022  Check In:  Session Check In - 06/08/22 1535       Check-In   Supervising physician immediately available to respond to emergencies See telemetry face sheet for immediately available ER MD    Location [facility]-Cardiac & Pulmonary Rehab    Staff Present Justin Mend, RCP,RRT,BSRT;Coren Sagan Sherryll Burger, RN Odelia Gage, RN, ADN    Virtual Visit No    Medication changes reported     No    Fall or balance concerns reported    No    Warm-up and Cool-down Performed on first and last piece of equipment    Resistance Training Performed Yes    VAD Patient? No    PAD/SET Patient? No      Pain Assessment   Currently in Pain? No/denies                Social History   Tobacco Use  Smoking Status Former   Packs/day: 0.25   Years: 20.00   Total pack years: 5.00   Types: Cigarettes   Quit date: 04/01/1974   Years since quitting: 48.2  Smokeless Tobacco Never    Goals Met:  Independence with exercise equipment Exercise tolerated well No report of concerns or symptoms today Strength training completed today  Goals Unmet:  Not Applicable  Comments: Pt able to follow exercise prescription today without complaint.  Will continue to monitor for progression.    Dr. Emily Filbert is Medical Director for [place].  Dr. Ottie Glazier is Medical Director for [facility] Pulmonary Rehabilitation.

## 2022-06-09 ENCOUNTER — Encounter: Payer: No Typology Code available for payment source | Admitting: *Deleted

## 2022-06-09 DIAGNOSIS — Z952 Presence of prosthetic heart valve: Secondary | ICD-10-CM | POA: Diagnosis not present

## 2022-06-09 NOTE — Progress Notes (Signed)
Daily Session Note  Patient Details  Name: Dustin Lara MRN: [id number] Date of Birth: [birth year] Referring Provider:   Flowsheet Row Cardiac Rehab from 05/04/2022 in [facility] Cardiac and Pulmonary Rehab  Referring Provider Thakkar       Encounter Date: 06/09/2022  Check In:  Session Check In - 06/09/22 1553       Check-In   Supervising physician immediately available to respond to emergencies See telemetry face sheet for immediately available ER MD    Location [facility]-Cardiac & Pulmonary Rehab    Staff Present Justin Mend, Lorre Nick, MA, RCEP, CCRP, CCET;Lamees Gable Sherryll Burger, RN BSN    Virtual Visit No    Medication changes reported     No    Fall or balance concerns reported    No    Warm-up and Cool-down Performed on first and last piece of equipment    Resistance Training Performed Yes    VAD Patient? No    PAD/SET Patient? No      Pain Assessment   Currently in Pain? No/denies                Social History   Tobacco Use  Smoking Status Former   Packs/day: 0.25   Years: 20.00   Total pack years: 5.00   Types: Cigarettes   Quit date: 04/01/1974   Years since quitting: 48.2  Smokeless Tobacco Never    Goals Met:  Independence with exercise equipment Exercise tolerated well No report of concerns or symptoms today Strength training completed today  Goals Unmet:  Not Applicable  Comments: Pt able to follow exercise prescription today without complaint.  Will continue to monitor for progression.    Dr. Emily Filbert is Medical Director for [place].  Dr. Ottie Glazier is Medical Director for [facility] Pulmonary Rehabilitation.

## 2022-06-13 ENCOUNTER — Encounter: Payer: No Typology Code available for payment source | Admitting: *Deleted

## 2022-06-13 DIAGNOSIS — Z952 Presence of prosthetic heart valve: Secondary | ICD-10-CM | POA: Diagnosis not present

## 2022-06-13 NOTE — Progress Notes (Signed)
2Daily Session Note  Patient Details  Name: RYETT HAMMAN MRN: [id number] Date of Birth: [birth year] Referring Provider:   Flowsheet Row Cardiac Rehab from 05/04/2022 in [facility] Cardiac and Pulmonary Rehab  Referring Provider Thakkar       Encounter Date: 06/13/2022  Check In:  Session Check In - 06/13/22 1542       Check-In   Supervising physician immediately available to respond to emergencies See telemetry face sheet for immediately available ER MD    Location [facility]-Cardiac & Pulmonary Rehab    Staff Present Antionette Fairy, BS, Exercise Physiologist;Joseph Rosebud Poles, RN, [place]    Virtual Visit No    Medication changes reported     No    Fall or balance concerns reported    No    Warm-up and Cool-down Performed on first and last piece of equipment    Resistance Training Performed Yes    VAD Patient? No    PAD/SET Patient? No      Pain Assessment   Currently in Pain? No/denies                Social History   Tobacco Use  Smoking Status Former   Packs/day: 0.25   Years: 20.00   Total pack years: 5.00   Types: Cigarettes   Quit date: 04/01/1974   Years since quitting: 48.2  Smokeless Tobacco Never    Goals Met:  Independence with exercise equipment Exercise tolerated well No report of concerns or symptoms today Strength training completed today  Goals Unmet:  Not Applicable  Comments: Pt able to follow exercise prescription today without complaint.  Will continue to monitor for progression.    Dr. Emily Filbert is Medical Director for [place].  Dr. Ottie Glazier is Medical Director for [facility] Pulmonary Rehabilitation.

## 2022-06-16 ENCOUNTER — Encounter: Payer: No Typology Code available for payment source | Admitting: *Deleted

## 2022-06-16 DIAGNOSIS — Z952 Presence of prosthetic heart valve: Secondary | ICD-10-CM

## 2022-06-16 NOTE — Progress Notes (Signed)
Daily Session Note  Patient Details  Name: JAQUANE BOUGHNER MRN: [id number] Date of Birth: 11-14-1940 Referring Provider:   Flowsheet Row Cardiac Rehab from 05/04/2022 in [facility] Cardiac and Pulmonary Rehab  Referring Provider Thakkar       Encounter Date: 06/16/2022  Check In:  Session Check In - 06/16/22 1539       Check-In   Supervising physician immediately available to respond to emergencies See telemetry face sheet for immediately available ER MD    Location [facility]-Cardiac & Pulmonary Rehab    Staff Present Renita Papa, RN BSN;Joseph [place], RCP,RRT,BSRT;Laureen [place], [place], RRT, CPFT    Virtual Visit No    Medication changes reported     No    Fall or balance concerns reported    No    Warm-up and Cool-down Performed on first and last piece of equipment    Resistance Training Performed Yes    VAD Patient? No    PAD/SET Patient? No      Pain Assessment   Currently in Pain? No/denies                Social History   Tobacco Use  Smoking Status Former   Packs/day: 0.25   Years: 20.00   Total pack years: 5.00   Types: Cigarettes   Quit date: 04/01/1974   Years since quitting: 48.2  Smokeless Tobacco Never    Goals Met:  Independence with exercise equipment Exercise tolerated well No report of concerns or symptoms today Strength training completed today  Goals Unmet:  Not Applicable  Comments: Pt able to follow exercise prescription today without complaint.  Will continue to monitor for progression.    Dr. Emily Filbert is Medical Director for [place].  Dr. Ottie Glazier is Medical Director for [facility] Pulmonary Rehabilitation.

## 2022-06-20 ENCOUNTER — Encounter: Payer: No Typology Code available for payment source | Admitting: *Deleted

## 2022-06-20 DIAGNOSIS — Z952 Presence of prosthetic heart valve: Secondary | ICD-10-CM

## 2022-06-20 NOTE — Progress Notes (Signed)
Daily Session Note  Patient Details  Name: Dustin Lara MRN: [id number] Date of Birth: 08/16/1940 Referring Provider:   Flowsheet Row Cardiac Rehab from 05/04/2022 in [facility] Cardiac and Pulmonary Rehab  Referring Provider Thakkar       Encounter Date: 06/20/2022  Check In:  Session Check In - 06/20/22 1552       Check-In   Supervising physician immediately available to respond to emergencies See telemetry face sheet for immediately available ER MD    Location [facility]-Cardiac & Pulmonary Rehab    Staff Present Meredith Craven, RN BSN;Laureen Brown, BS, RRT, CPFT;Noah Tickle, BS, Exercise Physiologist    Virtual Visit No    Medication changes reported     No    Fall or balance concerns reported    No    Warm-up and Cool-down Performed on first and last piece of equipment    Resistance Training Performed Yes    VAD Patient? No    PAD/SET Patient? No      Pain Assessment   Currently in Pain? No/denies                Social History   Tobacco Use  Smoking Status Former   Packs/day: 0.25   Years: 20.00   Total pack years: 5.00   Types: Cigarettes   Quit date: 04/01/1974   Years since quitting: 48.2  Smokeless Tobacco Never    Goals Met:  Independence with exercise equipment Exercise tolerated well No report of concerns or symptoms today Strength training completed today  Goals Unmet:  Not Applicable  Comments: Pt able to follow exercise prescription today without complaint.  Will continue to monitor for progression.    Dr. Mark Miller is Medical Director for [facility] Cardiac [facility].  Dr. Fuad Aleskerov is Medical Director for [facility] Pulmonary Rehabilitation. 

## 2022-06-22 ENCOUNTER — Encounter: Payer: No Typology Code available for payment source | Admitting: *Deleted

## 2022-06-22 DIAGNOSIS — Z952 Presence of prosthetic heart valve: Secondary | ICD-10-CM

## 2022-06-22 NOTE — Progress Notes (Signed)
Daily Session Note  Patient Details  Name: Dustin Lara MRN: [id number] Date of Birth: 12/22/1940 Referring Provider:   Flowsheet Row Cardiac Rehab from 05/04/2022 in [facility] Cardiac and Pulmonary Rehab  Referring Provider Thakkar       Encounter Date: 06/22/2022  Check In:  Session Check In - 06/22/22 1538       Check-In   Supervising physician immediately available to respond to emergencies See telemetry face sheet for immediately available ER MD    Location [facility]-Cardiac & Pulmonary Rehab    Staff Present Carson Myrtle, BS, RRT, CPFT;Zarius Furr Sherryll Burger, RN Odelia Gage, RN, ADN    Virtual Visit No    Medication changes reported     No    Fall or balance concerns reported    No    Warm-up and Cool-down Performed on first and last piece of equipment    Resistance Training Performed Yes    VAD Patient? No    PAD/SET Patient? No      Pain Assessment   Currently in Pain? No/denies                Social History   Tobacco Use  Smoking Status Former   Packs/day: 0.25   Years: 20.00   Total pack years: 5.00   Types: Cigarettes   Quit date: 04/01/1974   Years since quitting: 48.2  Smokeless Tobacco Never    Goals Met:  Independence with exercise equipment Exercise tolerated well No report of concerns or symptoms today Strength training completed today  Goals Unmet:  Not Applicable  Comments: Pt able to follow exercise prescription today without complaint.  Will continue to monitor for progression.    Dr. Emily Filbert is Medical Director for [place].  Dr. Ottie Glazier is Medical Director for [facility] Pulmonary Rehabilitation.

## 2022-06-27 ENCOUNTER — Encounter: Payer: No Typology Code available for payment source | Admitting: *Deleted

## 2022-06-27 DIAGNOSIS — Z952 Presence of prosthetic heart valve: Secondary | ICD-10-CM

## 2022-06-27 NOTE — Progress Notes (Signed)
Daily Session Note  Patient Details  Name: Dustin Lara MRN: [id number] Date of Birth: 1940-12-14 Referring Provider:   Flowsheet Row Cardiac Rehab from 05/04/2022 in [facility]  Referring Provider Thakkar       Encounter Date: 06/27/2022  Check In:  Session Check In - 06/27/22 [place]       Check-In   Supervising physician immediately available to respond to emergencies See telemetry face sheet for immediately available ER MD    Location [facility]    Staff Present Carson Myrtle, BS, RRT, CPFT;Midas Daughety Sherryll Burger, RN BSN;Noah Tickle, BS, Exercise Physiologist    Virtual Visit No    Medication changes reported     No    Fall or balance concerns reported    No    Warm-up and Cool-down Performed on first and last piece of equipment    Resistance Training Performed Yes    VAD Patient? No    PAD/SET Patient? No      Pain Assessment   Currently in Pain? No/denies                Social History   Tobacco Use  Smoking Status Former   Packs/day: 0.25   Years: 20.00   Total pack years: 5.00   Types: Cigarettes   Quit date: 04/01/1974   Years since quitting: 48.2  Smokeless Tobacco Never    Goals Met:  Independence with exercise equipment Exercise tolerated well No report of concerns or symptoms today Strength training completed today  Goals Unmet:  Not Applicable  Comments: Pt able to follow exercise prescription today without complaint.  Will continue to monitor for progression.    Dr. Emily Filbert is Medical Director for [place].  Dr. Ottie Glazier is Medical Director for [facility].

## 2022-06-29 ENCOUNTER — Encounter: Payer: Self-pay | Admitting: *Deleted

## 2022-06-29 DIAGNOSIS — Z952 Presence of prosthetic heart valve: Secondary | ICD-10-CM

## 2022-06-29 NOTE — Progress Notes (Signed)
Cardiac Individual Treatment Plan  Patient Details  Name: Dustin Lara MRN: [id number] Date of Birth: 02-25-1941 Referring Provider:   Flowsheet Row Cardiac Rehab from 05/04/2022 in [facility] Cardiac and Pulmonary Rehab  Referring Provider Thakkar       Initial Encounter Date:  Flowsheet Row Cardiac Rehab from 05/04/2022 in [facility] Cardiac and Pulmonary Rehab  Date 05/04/22       Visit Diagnosis: S/P TAVR (transcatheter aortic valve replacement)  Patient's Home Medications on Admission:  Current Outpatient Medications:    acetaminophen (TYLENOL) 325 MG tablet, TAKE TWO TABLETS BY MOUTH THREE TIMES A DAY AS NEEDED, Disp: , Rfl:    albuterol (VENTOLIN HFA) 108 (90 Base) MCG/ACT inhaler, INHALE 2 PUFFS BY ORAL INHALATION 3 TIMES A DAY AS NEEDED FOR BREATHING. BE SURE TO [place] MOUTHPIECE WITH WARM WATER ONCE A WEEK, Disp: , Rfl:    Alogliptin Benzoate 6.25 MG TABS, TAKE ONE TABLET BY MOUTH EVERY MORNING FOR DIABETES, Disp: , Rfl:    amLODipine (NORVASC) 5 MG tablet, TAKE ONE-HALF TABLET BY MOUTH EVERY DAY ESSENTIAL HYPERTENSION FOR BLOOD PRESSURE, Disp: , Rfl:    ascorbic acid (VITAMIN C) 100 MG tablet, TAKE  BY MOUTH, Disp: , Rfl:    atorvastatin (LIPITOR) 20 MG tablet, TAKE ONE TABLET (20MG) BY MOUTH AT BEDTIME, Disp: , Rfl:    Calcium Carb-Cholecalciferol 500-10 MG-MCG TABS, Take 1 tablet by mouth daily., Disp: , Rfl:    Calcium Carbonate-Vitamin D 250-3.125 MG-MCG TABS, TAKE  BY MOUTH, Disp: , Rfl:    chlorthalidone (HYGROTON) 25 MG tablet, TAKE ONE-HALF TABLET BY MOUTH EVERY DAY FOR BLOOD PRESSURE *IMPORTANT STOP TAKING  HYDROCHLOROTHIZAIDE, Disp: , Rfl:    ferrous gluconate (FERGON) 324 MG tablet, Take 1 tablet by mouth every other day., Disp: , Rfl:    ferrous sulfate 325 (65 FE) MG EC tablet, Take by mouth., Disp: , Rfl:    finasteride (PROSCAR) 5 MG tablet, Take 1 tablet by mouth daily., Disp: , Rfl:    fluticasone-salmeterol (ADVAIR) 100-50 MCG/ACT AEPB, INHALE 1 PUFF BY MOUTH EVERY  12 HOURS, Disp: , Rfl:    glipiZIDE (GLUCOTROL) 10 MG tablet, TAKE ONE TABLET BY MOUTH TWO TIMES A DAY . DOSE INCREASE, Disp: , Rfl:    glucose blood (PRECISION QID TEST) test strip, Use 1 strip twice a week, Disp: , Rfl:    ketoconazole (NIZORAL) 2 % shampoo, SHAMPOO AS DIRECTED TOPICALLY TUESDAY,THURSDAY,SATURDAY AS NEEDED, Disp: , Rfl:    losartan (COZAAR) 50 MG tablet, TAKE ONE TABLET BY MOUTH EVERY DAY ESSENTIAL HYPERTENSION, Disp: , Rfl:    polyethylene glycol (MIRALAX / GLYCOLAX) 17 g packet, Take by mouth., Disp: , Rfl:    senna-docusate (SENOKOT-S) 8.6-50 MG tablet, Take by mouth., Disp: , Rfl:    tamsulosin (FLOMAX) 0.4 MG CAPS capsule, Take 1 capsule by mouth every evening., Disp: , Rfl:    Tiotropium Bromide Monohydrate 2.5 MCG/ACT AERS, INHALE 2 INHALATIONS BY ORAL INHALATION EVERY DAY, Disp: , Rfl:    traZODone (DESYREL) 50 MG tablet, TAKE ONE AND ONE-HALF TABLETS BY MOUTH AT BEDTIME FOR SLEEP AND MOOD, Disp: , Rfl:   Past Medical History: Past Medical History:  Diagnosis Date   Hypertension     Tobacco Use: Social History   Tobacco Use  Smoking Status Former   Packs/day: 0.25   Years: 20.00   Total pack years: 5.00   Types: Cigarettes   Quit date: 04/01/1974   Years since quitting: 48.2  Smokeless Tobacco Never  Labs: Review Flowsheet        No data to display           Exercise Target Goals: Exercise Program Goal: Individual exercise prescription set using results from initial 6 min walk test and THRR while considering  patient's activity barriers and safety.   Exercise Prescription Goal: Initial exercise prescription builds to 30-45 minutes a day of aerobic activity, 2-3 days per week.  Home exercise guidelines will be given to patient during program as part of exercise prescription that the participant will acknowledge.   Education: Aerobic Exercise: - Group verbal and visual presentation on the components of exercise prescription. Introduces  F.I.T.T principle from ACSM for exercise prescriptions.  Reviews F.I.T.T. principles of aerobic exercise including progression. Written material given at graduation. Flowsheet Row Cardiac Rehab from 06/22/2022 in [facility] Cardiac and Pulmonary Rehab  Education need identified 05/04/22       Education: Resistance Exercise: - Group verbal and visual presentation on the components of exercise prescription. Introduces F.I.T.T principle from ACSM for exercise prescriptions  Reviews F.I.T.T. principles of resistance exercise including progression. Written material given at graduation. Flowsheet Row Cardiac Rehab from 06/22/2022 in [facility] Cardiac and Pulmonary Rehab  Date 06/22/22  Educator KW  Instruction Review Code 1- [employer] Understanding        Education: Exercise & Equipment Safety: - Individual verbal instruction and demonstration of equipment use and safety with use of the equipment. Flowsheet Row Cardiac Rehab from 06/22/2022 in [facility] Cardiac and Pulmonary Rehab  Date 04/25/22  Educator [facility]  Instruction Review Code 1- Verbalizes Understanding       Education: Exercise Physiology & General Exercise Guidelines: - Group verbal and written instruction with models to review the exercise physiology of the cardiovascular system and associated critical values. Provides general exercise guidelines with specific guidelines to those with heart or lung disease.    Education: Flexibility, Balance, Mind/Body Relaxation: - Group verbal and visual presentation with interactive activity on the components of exercise prescription. Introduces F.I.T.T principle from ACSM for exercise prescriptions. Reviews F.I.T.T. principles of flexibility and balance exercise training including progression. Also discusses the mind body connection.  Reviews various relaxation techniques to help reduce and manage stress (i.e. Deep breathing, progressive muscle relaxation, and visualization). Balance handout provided to take  home. Written material given at graduation. Flowsheet Row Cardiac Rehab from 06/22/2022 in [facility] Cardiac and Pulmonary Rehab  Date 06/22/22  Educator KW  Instruction Review Code 1- Verbalizes Understanding       Activity Barriers & Risk Stratification:  Activity Barriers & Cardiac Risk Stratification - 05/04/22 1505       Activity Barriers & Cardiac Risk Stratification   Activity Barriers Muscular Weakness;Deconditioning;Shortness of Breath    Cardiac Risk Stratification High             6 Minute Walk:  6 Minute Walk     Row Name 05/04/22 1504         6 Minute Walk   Phase Initial     Distance 880 feet     Walk Time 6 minutes     # of Rest Breaks 0     MPH 1.67     METS 1.55     RPE 11     Perceived Dyspnea  1     VO2 Peak 5.43     Symptoms Yes (comment)     Comments Chest Tightness, SOB     Resting HR 64 bpm     Resting BP  128/56     Resting Oxygen Saturation  97 %     Exercise Oxygen Saturation  during 6 min walk 99 %     Max Ex. HR 103 bpm     Max Ex. BP 148/62     2 Minute Post BP 136/66              Oxygen Initial Assessment:   Oxygen Re-Evaluation:   Oxygen Discharge (Final Oxygen Re-Evaluation):   Initial Exercise Prescription:  Initial Exercise Prescription - 05/04/22 1500       Date of Initial Exercise RX and Referring Provider   Date 05/04/22    Referring Provider Thakkar      Oxygen   Maintain Oxygen Saturation 88% or higher      Treadmill   MPH 1.8    Grade 0.5    Minutes 15    METs 2.5      Recumbant Bike   Level 1    RPM 50    Watts 12    Minutes 15    METs 1.55      NuStep   Level 2    SPM 80    Minutes 15    METs 1.55      T5 Nustep   Level 1    SPM 80    Minutes 15    METs 1.55      Prescription Details   Frequency (times per week) 3    Duration Progress to 30 minutes of continuous aerobic without signs/symptoms of physical distress      Intensity   THRR 40-80% of Max Heartrate 93-123    Ratings  of Perceived Exertion 11-13    Perceived Dyspnea 0-4      Progression   Progression Continue to progress workloads to maintain intensity without signs/symptoms of physical distress.      Resistance Training   Training Prescription Yes    Weight 3 lb    Reps 10-15             Perform Capillary Blood Glucose checks as needed.  Exercise Prescription Changes:   Exercise Prescription Changes     Row Name 05/04/22 1500 05/16/22 1300 05/26/22 1500 05/31/22 1400 06/14/22 1500     Response to Exercise   Blood Pressure (Admit) 128/56 104/64 -- 134/66 134/64   Blood Pressure (Exercise) 148/62 124/64 -- 140/77 --   Blood Pressure (Exit) 136/66 120/60 -- 124/56 126/54   Heart Rate (Admit) 64 bpm 8 bpm -- 76 bpm 86 bpm   Heart Rate (Exercise) 103 bpm 133 bpm -- 147 bpm 138 bpm   Heart Rate (Exit) 59 bpm 95 bpm -- 111 bpm 94 bpm   Oxygen Saturation (Admit) 97 % -- -- -- --   Oxygen Saturation (Exercise) 99 % -- -- -- --   Rating of Perceived Exertion (Exercise) 11 15 -- 13 12   Perceived Dyspnea (Exercise) 1 -- -- -- --   Symptoms chest tightness, SOB none -- none none   Comments 6MWT Results 3rd full day oof exercise -- -- --   Duration -- Progress to 30 minutes of  aerobic without signs/symptoms of physical distress -- Continue with 30 min of aerobic exercise without signs/symptoms of physical distress. Continue with 30 min of aerobic exercise without signs/symptoms of physical distress.   Intensity -- THRR unchanged -- THRR unchanged THRR unchanged     Progression   Progression -- Continue to progress workloads to maintain intensity without signs/symptoms of physical  distress. -- Continue to progress workloads to maintain intensity without signs/symptoms of physical distress. Continue to progress workloads to maintain intensity without signs/symptoms of physical distress.   Average METs -- 2.46 -- 3.12 2.99     Resistance Training   Training Prescription -- Yes -- Yes Yes   Weight  -- 3 lb -- 3 lb 3 lb   Reps -- 10-15 -- 10-15 10-15     Interval Training   Interval Training -- No -- No No     Treadmill   MPH -- 2 -- 2.5 2.5   Grade -- 0.5 -- 3 1.5   Minutes -- 15 -- 15 15   METs -- 2.67 -- 3.95 3.43     Recumbant Bike   Level -- 2 -- 3 --   Watts -- 25 -- 23 --   Minutes -- 15 -- 15 --   METs -- 2.9 -- -- --     NuStep   Level -- 3 -- 3 3   Minutes -- 15 -- 15 15   METs -- 2.4 -- 3 3     REL-XR   Level -- -- -- -- 1   Minutes -- -- -- -- 15   METs -- -- -- -- 2.2     Home Exercise Plan   Plans to continue exercise at -- -- Home (comment)  Walking and bands at home; considering joining [employer] Home (comment)  Walking and bands at home; considering joining [employer] Home (comment)  Walking and bands at home; considering joining [employer]   Frequency -- -- Add 2 additional days to program exercise sessions. Add 2 additional days to program exercise sessions. Add 2 additional days to program exercise sessions.   Initial Home Exercises Provided -- -- 05/26/22 05/26/22 05/26/22     Oxygen   Maintain Oxygen Saturation -- 88% or higher 88% or higher 88% or higher 88% or higher            Exercise Comments:   Exercise Comments     Row Name 05/05/22 1607           Exercise Comments First full day of exercise!  Patient was oriented to gym and equipment including functions, settings, policies, and procedures.  Patient's individual exercise prescription and treatment plan were reviewed.  All starting workloads were established based on the results of the 6 minute walk test done at initial orientation visit.  The plan for exercise progression was also introduced and progression will be customized based on patient's performance and goals.                Exercise Goals and Review:   Exercise Goals     Row Name 05/04/22 1517             Exercise Goals   Increase Physical Activity Yes       Intervention Provide advice,  education, support and counseling about physical activity/exercise needs.;Develop an individualized exercise prescription for aerobic and resistive training based on initial evaluation findings, risk stratification, comorbidities and participant's personal goals.       Expected Outcomes Short Term: Attend rehab on a regular basis to increase amount of physical activity.;Long Term: Add in home exercise to make exercise part of routine and to increase amount of physical activity.;Long Term: Exercising regularly at least 3-5 days a week.       Increase Strength and Stamina Yes       Intervention Develop an individualized  exercise prescription for aerobic and resistive training based on initial evaluation findings, risk stratification, comorbidities and participant's personal goals.;Provide advice, education, support and counseling about physical activity/exercise needs.       Expected Outcomes Short Term: Increase workloads from initial exercise prescription for resistance, speed, and METs.;Short Term: Perform resistance training exercises routinely during rehab and add in resistance training at home;Long Term: Improve cardiorespiratory fitness, muscular endurance and strength as measured by increased METs and functional capacity (6MWT)       Able to understand and use rate of perceived exertion (RPE) scale Yes       Intervention Provide education and explanation on how to use RPE scale       Expected Outcomes Short Term: Able to use RPE daily in rehab to express subjective intensity level;Long Term:  Able to use RPE to guide intensity level when exercising independently       Able to understand and use Dyspnea scale Yes       Intervention Provide education and explanation on how to use Dyspnea scale       Expected Outcomes Long Term: Able to use Dyspnea scale to guide intensity level when exercising independently;Short Term: Able to use Dyspnea scale daily in rehab to express subjective sense of shortness of  breath during exertion       Knowledge and understanding of Target Heart Rate Range (THRR) Yes       Intervention Provide education and explanation of THRR including how the numbers were predicted and where they are located for reference       Expected Outcomes Short Term: Able to state/look up THRR;Long Term: Able to use THRR to govern intensity when exercising independently;Short Term: Able to use daily as guideline for intensity in rehab       Able to check pulse independently Yes       Intervention Provide education and demonstration on how to check pulse in carotid and radial arteries.;Review the importance of being able to check your own pulse for safety during independent exercise       Expected Outcomes Short Term: Able to explain why pulse checking is important during independent exercise;Long Term: Able to check pulse independently and accurately       Understanding of Exercise Prescription Yes       Intervention Provide education, explanation, and written materials on patient's individual exercise prescription       Expected Outcomes Short Term: Able to explain program exercise prescription;Long Term: Able to explain home exercise prescription to exercise independently                Exercise Goals Re-Evaluation :  Exercise Goals Re-Evaluation     Row Name 05/05/22 1607 05/16/22 1310 05/26/22 1600 05/31/22 1413 06/09/22 1543     Exercise Goal Re-Evaluation   Exercise Goals Review Knowledge and understanding of Target Heart Rate Range (THRR);Able to understand and use rate of perceived exertion (RPE) scale;Understanding of Exercise Prescription;Able to understand and use Dyspnea scale Increase Physical Activity;Increase Strength and Stamina;Understanding of Exercise Prescription Increase Physical Activity;Increase Strength and Stamina;Understanding of Exercise Prescription Increase Physical Activity;Increase Strength and Stamina;Understanding of Exercise Prescription Increase  Physical Activity;Increase Strength and Stamina;Understanding of Exercise Prescription   Comments Reviewed RPE and dyspnea scales, THR and program prescription with pt today.  Pt voiced understanding and was given a copy of goals to take home. Dustin Lara is off to a good start with rehab for the first couple of sessions he has been here. He  is able to do his initial exercise prescription and is already up to level 3 on the T4 Nustep. We will contiue to monitor as he progresses. Reviewed home exercise with pt today.  Pt plans to walk and use resistance bands at home for exercise. He is also considering joining [employer]. Reviewed THR, pulse, RPE, sign and symptoms, pulse oximetery and when to call 911 or MD.  Also discussed weather considerations and indoor options.  Pt voiced understanding. Dustin Lara continues to do well in rehab. He recently improved his overall average MET level to 3.12 METs. He also increased his workload on the treadmill to 2.5 mph and an incline of 3%. He has tolerated 3 lb for hand weights as well. We will continue to monitor his progress in the program. Dustin Lara is doing well in rehab. He has been progressing with his workloads well. He states that he is feeling much better since starting the program. Dustin Lara is also doing some walking at home a couple of times a week when it is warm outside. He also states that he purchased 5 lb hand weights for resistance training at home. We will continue to monitor his progress in the program.   Expected Outcomes Short: Use RPE daily to regulate intensity. Long: Follow program prescription in THR. Short: Continue curent exercise prescription and attend rehab Long: Improve overall strength and stamina Short: Begin to walk more on days away from rehab. Long: Continue to increase strength and stamina. Short: Continue to increase workloads as tolerated. Long: Continue to increase strength and stamina. Short: Continue to walk at home and begin using hand weights.  Long: Continue to exercise independently.    Dustin Lara Name 06/14/22 1613             Exercise Goal Re-Evaluation   Exercise Goals Review Increase Physical Activity;Increase Strength and Stamina;Understanding of Exercise Prescription       Comments Dustin Lara continues to do well in rehab. He has been at a consistent 2.5/1.5% workload on the treadmill, his HR has been getting a little high on the treadmill only, and will try to modify his workload to bring it down a bit- he is asymptomatic. He has been at level 1 on the XR and would benefit from increasing. Will continue to monitor.       Expected Outcomes Short: Increase to level 2 on XR Long: Continue to increase overall MET level                Discharge Exercise Prescription (Final Exercise Prescription Changes):  Exercise Prescription Changes - 06/14/22 1500       Response to Exercise   Blood Pressure (Admit) 134/64    Blood Pressure (Exit) 126/54    Heart Rate (Admit) 86 bpm    Heart Rate (Exercise) 138 bpm    Heart Rate (Exit) 94 bpm    Rating of Perceived Exertion (Exercise) 12    Symptoms none    Duration Continue with 30 min of aerobic exercise without signs/symptoms of physical distress.    Intensity THRR unchanged      Progression   Progression Continue to progress workloads to maintain intensity without signs/symptoms of physical distress.    Average METs 2.99      Resistance Training   Training Prescription Yes    Weight 3 lb    Reps 10-15      Interval Training   Interval Training No      Treadmill   MPH 2.5    Grade  1.5    Minutes 15    METs 3.43      NuStep   Level 3    Minutes 15    METs 3      REL-XR   Level 1    Minutes 15    METs 2.2      Home Exercise Plan   Plans to continue exercise at Home (comment)   Walking and bands at home; considering joining [employer]   Frequency Add 2 additional days to program exercise sessions.    Initial Home Exercises Provided 05/26/22      Oxygen    Maintain Oxygen Saturation 88% or higher             Nutrition:  Target Goals: Understanding of nutrition guidelines, daily intake of sodium <1569m, cholesterol <2029m calories 30% from fat and 7% or less from saturated fats, daily to have 5 or more servings of fruits and vegetables.  Education: All About Nutrition: -Group instruction provided by verbal, written material, interactive activities, discussions, models, and posters to present general guidelines for heart healthy nutrition including fat, fiber, MyPlate, the role of sodium in heart healthy nutrition, utilization of the nutrition label, and utilization of this knowledge for meal planning. Follow up email sent as well. Written material given at graduation. Flowsheet [facility]  Education need identified 05/04/22       Biometrics:  Pre Biometrics - 05/04/22 1517       Pre Biometrics   Height 5' 6.4" (1.687 m)    Weight 184 lb 6.4 oz (83.6 kg)    Waist Circumference 41 inches    Hip Circumference 41.5 inches    Waist to Hip Ratio 0.99 %    BMI (Calculated) 29.39    Single Leg Stand 5.45 seconds   L             Nutrition Therapy Plan and Nutrition Goals:  Nutrition Therapy & Goals - 05/04/22 1445       Nutrition Therapy   Diet Heart healthy, low Na, T2DM MNT    Drug/Food Interactions Statins/Certain Fruits    Protein (specify units) 90-95g    Fiber 25 grams    Whole Grain Foods 3 servings    Saturated Fats 14 max. grams    Fruits and Vegetables 8 servings/day    Sodium 2 grams      Personal Nutrition Goals   Nutrition Goal ST: add in protein rich snack, add non-starchy vegetables and fruit to meals LT: increase fruit and vegetable intake to at least 5 per day, limit sodium <2g/day, meet energy and protein needs    Comments He chooses whole wheat bread instead of white bread. Food recall: B: bowl of cereal (frosted wheat as well as another cereal  with pecans and freeze dried strawberries, 2% milk) or bacon/sausage and eggs (tears up whole wheat bread to eat it). Sometimes won't eat anything in the morning. S: sometimes pack of nabs D: tonight he will have hot dogs with cheese (1x/week), homemade tacos, sandwich (whole wheat bread), [employer]. He goes out to eat 2x/week. Drinks: diet soda (1-3) or flavored water, water. He reports his BG has been running higher than normal. he reports his breathing has improved overall since being in the hospital. Discussed general heart healthy eating and reviewed T2DM MNT as well as pulmnary MNT. Encouraged to start with adding in additional non-starchy vegetables and to add in a snack mid-day  with protein as he is not eating lunch most days; boiled egg, greek yogurt, peanut butter - he reports enjoying peanut butter and banana sandwiches so he may have that as well. So that he doesn't need to cook additional food, suggested he could use frozen or canned fruits/vegetables; discussed sodium and sugar considerations with canned foods.      Intervention Plan   Intervention Prescribe, educate and counsel regarding individualized specific dietary modifications aiming towards targeted core components such as weight, hypertension, lipid management, diabetes, heart failure and other comorbidities.;Nutrition handout(s) given to patient.    Expected Outcomes Short Term Goal: Understand basic principles of dietary content, such as calories, fat, sodium, cholesterol and nutrients.;Short Term Goal: A plan has been developed with personal nutrition goals set during dietitian appointment.;Long Term Goal: Adherence to prescribed nutrition plan.             Nutrition Assessments:  MEDIFICTS Score Key: ?70 Need to make dietary changes  40-70 Heart Healthy Diet ? 40 Therapeutic Level Cholesterol Diet  Flowsheet Row Cardiac Rehab from 05/04/2022 in [facility] Cardiac and Pulmonary Rehab  Picture Your Plate Total Score on  Admission 53      Picture Your Plate Scores: <71 Unhealthy dietary pattern with much room for improvement. 41-50 Dietary pattern unlikely to meet recommendations for good health and room for improvement. 51-60 More healthful dietary pattern, with some room for improvement.  >60 Healthy dietary pattern, although there may be some specific behaviors that could be improved.    Nutrition Goals Re-Evaluation:  Nutrition Goals Re-Evaluation     [place] Name 06/09/22 1554             Goals   Nutrition Goal ST: add in protein rich snack, add non-starchy vegetables and fruit to meals LT: increase fruit and vegetable intake to at least 5 per day, limit sodium <2g/day, meet energy and protein needs       Expected Outcome Short: Continue to add in protein rich snack. Long: Continue to practice heart healthy eating patterns.                Nutrition Goals Discharge (Final Nutrition Goals Re-Evaluation):  Nutrition Goals Re-Evaluation - 06/09/22 1554       Goals   Nutrition Goal ST: add in protein rich snack, add non-starchy vegetables and fruit to meals LT: increase fruit and vegetable intake to at least 5 per day, limit sodium <2g/day, meet energy and protein needs    Expected Outcome Short: Continue to add in protein rich snack. Long: Continue to practice heart healthy eating patterns.             Psychosocial: Target Goals: Acknowledge presence or absence of significant depression and/or stress, maximize coping skills, provide positive support system. Participant is able to verbalize types and ability to use techniques and skills needed for reducing stress and depression.   Education: Stress, Anxiety, and Depression - Group verbal and visual presentation to define topics covered.  Reviews how body is impacted by stress, anxiety, and depression.  Also discusses healthy ways to reduce stress and to treat/manage anxiety and depression.  Written material given at graduation. Flowsheet  Row Cardiac Rehab from 06/22/2022 in [facility] Cardiac and Pulmonary Rehab  Education need identified 05/04/22       Education: Sleep Hygiene -Provides group verbal and written instruction about how sleep can affect your health.  Define sleep hygiene, discuss sleep cycles and impact of sleep habits. Review good sleep hygiene tips.  Initial Review & Psychosocial Screening:  Initial Psych Review & Screening - 04/25/22 1036       Initial Review   Current issues with None Identified      Family Dynamics   Good Support System? Yes    Comments [place] daughter and step son are good support systems for him. He other Dustin Lara looks after him as well. He lives by himself, his wife passed away four years ago.      Barriers   Psychosocial barriers to participate in program There are no identifiable barriers or psychosocial needs.;The patient should benefit from training in stress management and relaxation.      Screening Interventions   Interventions Encouraged to exercise;To provide support and resources with identified psychosocial needs;Provide feedback about the scores to participant    Expected Outcomes Short Term goal: Utilizing psychosocial counselor, staff and physician to assist with identification of specific Stressors or current issues interfering with healing process. Setting desired goal for each stressor or current issue identified.;Long Term Goal: Stressors or current issues are controlled or eliminated.;Short Term goal: Identification and review with participant of any Quality of Life or Depression concerns found by scoring the questionnaire.;Long Term goal: The participant improves quality of Life and PHQ9 Scores as seen by post scores and/or verbalization of changes             Quality of Life Scores:   Quality of Life - 05/04/22 1459       Quality of Life   Select Quality of Life      Quality of Life Scores   Health/Function Pre 24.36 %    Socioeconomic Pre 25.86 %     Psych/Spiritual Pre 29.14 %    Family Pre 22.88 %    GLOBAL Pre 25.55 %            Scores of 19 and below usually indicate a poorer quality of life in these areas.  A difference of  2-3 points is a clinically meaningful difference.  A difference of 2-3 points in the total score of the Quality of Life Index has been associated with significant improvement in overall quality of life, self-image, physical symptoms, and general health in studies assessing change in quality of life.  PHQ-9: Review Flowsheet       06/06/2022 06/01/2022 05/04/2022  Depression screen PHQ 2/9  Decreased Interest 0 0 0  Down, Depressed, Hopeless 0 0 1  PHQ - 2 Score 0 0 1  Altered sleeping 0 0 0  Tired, decreased energy _0 Change in appetite 0 0 0  Feeling bad or failure about yourself  0 0 1  Trouble concentrating 0 0 0  Moving slowly or fidgety/restless 0 0 2  Suicidal thoughts 0 0 0  PHQ-9 Score _1 Difficult doing work/chores Not difficult at all Not difficult at all Not difficult at all   Interpretation of Total Score  Total Score Depression Severity:  1-4 = Minimal depression, 5-9 = Mild depression, 10-14 = Moderate depression, 15-19 = Moderately severe depression, 20-27 = Severe depression   Psychosocial Evaluation and Intervention:  Psychosocial Evaluation - 04/25/22 1038       Psychosocial Evaluation & Interventions   Interventions Encouraged to exercise with the program and follow exercise prescription;Stress management education;Relaxation education    Comments Dustin Lara daughter and step son are good support systems for him. He other Dustin Lara looks after him as well. He lives by himself, his wife passed  away four years ago.    Expected Outcomes Short: Start [facility] to help with mood. Long: Maintain a healthy mental state    Continue Psychosocial Services  Follow up required by staff             Psychosocial Re-Evaluation:  Psychosocial Re-Evaluation     [place] Name  06/09/22 1547             Psychosocial Re-Evaluation   Current issues with None Identified       Comments Dustin Lara denies any major stressors at this time. He reports having a good support system made up by his children. He states that he also can turn to members of his church for support. He also states that he is sleeping well at this time. Dustin Lara also reports that puzzles and exercise are good avenues for stress relief as well.       Expected Outcomes Short: Continue to attend rehab for stress relief. Long: Maintain positive outlook       Continue Psychosocial Services  Follow up required by staff                Psychosocial Discharge (Final Psychosocial Re-Evaluation):  Psychosocial Re-Evaluation - 06/09/22 1547       Psychosocial Re-Evaluation   Current issues with None Identified    Comments Dustin Lara denies any major stressors at this time. He reports having a good support system made up by his children. He states that he also can turn to members of his church for support. He also states that he is sleeping well at this time. Dustin Lara also reports that puzzles and exercise are good avenues for stress relief as well.    Expected Outcomes Short: Continue to attend rehab for stress relief. Long: Maintain positive outlook    Continue Psychosocial Services  Follow up required by staff             Vocational Rehabilitation: Provide vocational rehab assistance to qualifying candidates.   Vocational Rehab Evaluation & Intervention:   Education: Education Goals: Education classes will be provided on a variety of topics geared toward better understanding of heart health and risk factor modification. Participant will state understanding/return demonstration of topics presented as noted by education test scores.  Learning Barriers/Preferences:  Learning Barriers/Preferences - 04/25/22 1032       Learning Barriers/Preferences   Learning Barriers None    Learning Preferences None              General Cardiac Education Topics:  AED/CPR: - Group verbal and written instruction with the use of models to demonstrate the basic use of the AED with the basic ABC's of resuscitation.   Anatomy and Cardiac Procedures: - Group verbal and visual presentation and models provide information about basic cardiac anatomy and function. Reviews the testing methods done to diagnose heart disease and the outcomes of the test results. Describes the treatment choices: Medical Management, Angioplasty, or Coronary Bypass Surgery for treating various heart conditions including Myocardial Infarction, Angina, Valve Disease, and Cardiac Arrhythmias.  Written material given at graduation. Flowsheet Row Cardiac Rehab from 06/22/2022 in [facility] Cardiac and Pulmonary Rehab  Education need identified 05/04/22       Medication Safety: - Group verbal and visual instruction to review commonly prescribed medications for heart and lung disease. Reviews the medication, class of the drug, and side effects. Includes the steps to properly store meds and maintain the prescription regimen.  Written material given at graduation.   Intimacy: - Group  verbal instruction through game format to discuss how heart and lung disease can affect sexual intimacy. Written material given at graduation..   Know Your Numbers and Heart Failure: - Group verbal and visual instruction to discuss disease risk factors for cardiac and pulmonary disease and treatment options.  Reviews associated critical values for Overweight/Obesity, Hypertension, Cholesterol, and Diabetes.  Discusses basics of heart failure: signs/symptoms and treatments.  Introduces Heart Failure Zone chart for action plan for heart failure.  Written material given at graduation. Flowsheet Row Cardiac Rehab from 06/22/2022 in [facility] Cardiac and Pulmonary Rehab  Date 05/11/22  Educator SB  Instruction Review Code 5- Refused Teaching       Infection Prevention: -  Provides verbal and written material to individual with discussion of infection control including proper hand washing and proper equipment cleaning during exercise session. Flowsheet Row Cardiac Rehab from 06/22/2022 in [facility] Cardiac and Pulmonary Rehab  Date 04/25/22  Educator [facility]  Instruction Review Code 1- Verbalizes Understanding       Falls Prevention: - Provides verbal and written material to individual with discussion of falls prevention and safety. Flowsheet Row Cardiac Rehab from 06/22/2022 in [facility] Cardiac and Pulmonary Rehab  Date 04/25/22  Educator [facility]  Instruction Review Code 1- Verbalizes Understanding       Other: -Provides group and verbal instruction on various topics (see comments)   Knowledge Questionnaire Score:  Knowledge Questionnaire Score - 05/04/22 1501       Knowledge Questionnaire Score   Pre Score 18/26             Core Components/Risk Factors/Patient Goals at Admission:  Personal Goals and Risk Factors at Admission - 05/04/22 1501       Core Components/Risk Factors/Patient Goals on Admission    Weight Management Yes;Weight Maintenance    Intervention Weight Management: Develop a combined nutrition and exercise program designed to reach desired caloric intake, while maintaining appropriate intake of nutrient and fiber, sodium and fats, and appropriate energy expenditure required for the weight goal.;Weight Management: Provide education and appropriate resources to help participant work on and attain dietary goals.;Weight Management/Obesity: Establish reasonable short term and long term weight goals.    Admit Weight 184 lb 6.4 oz (83.6 kg)    Goal Weight: Short Term 180 lb (81.6 kg)    Goal Weight: Long Term 175 lb (79.4 kg)    Expected Outcomes Short Term: Continue to assess and modify interventions until short term weight is achieved;Long Term: Adherence to nutrition and physical activity/exercise program aimed toward attainment of established  weight goal;Weight Maintenance: Understanding of the daily nutrition guidelines, which includes 25-35% calories from fat, 7% or less cal from saturated fats, less than 236m cholesterol, less than 1.5gm of sodium, & 5 or more servings of fruits and vegetables daily;Understanding recommendations for meals to include 15-35% energy as protein, 25-35% energy from fat, 35-60% energy from carbohydrates, less than 2066mof dietary cholesterol, 20-35 gm of total fiber daily;Understanding of distribution of calorie intake throughout the day with the consumption of 4-5 meals/snacks    Diabetes Yes    Intervention Provide education about signs/symptoms and action to take for hypo/hyperglycemia.;Provide education about proper nutrition, including hydration, and aerobic/resistive exercise prescription along with prescribed medications to achieve blood glucose in normal ranges: Fasting glucose 65-99 mg/dL    Expected Outcomes Short Term: Participant verbalizes understanding of the signs/symptoms and immediate care of hyper/hypoglycemia, proper foot care and importance of medication, aerobic/resistive exercise and nutrition plan for blood glucose control.;Long  Term: Attainment of HbA1C < 7%.    Hypertension Yes    Intervention Provide education on lifestyle modifcations including regular physical activity/exercise, weight management, moderate sodium restriction and increased consumption of fresh fruit, vegetables, and low fat dairy, alcohol moderation, and smoking cessation.;Monitor prescription use compliance.    Expected Outcomes Short Term: Continued assessment and intervention until BP is < 140/68m HG in hypertensive participants. < 130/861mHG in hypertensive participants with diabetes, heart failure or chronic kidney disease.;Long Term: Maintenance of blood pressure at goal levels.    Lipids Yes    Intervention Provide education and support for participant on nutrition & aerobic/resistive exercise along with  prescribed medications to achieve LDL <7073mHDL >57m14m  Expected Outcomes Short Term: Participant states understanding of desired cholesterol values and is compliant with medications prescribed. Participant is following exercise prescription and nutrition guidelines.;Long Term: Cholesterol controlled with medications as prescribed, with individualized exercise RX and with personalized nutrition plan. Value goals: LDL < 70mg77mL > 40 mg.             Education:Diabetes - Individual verbal and written instruction to review signs/symptoms of diabetes, desired ranges of glucose level fasting, after meals and with exercise. Acknowledge that pre and post exercise glucose checks will be done for 3 sessions at entry of program. [place] [facility]  Date 04/25/22  Educator [facility] Ant[facility] Review Code 1- Verbalizes Understanding       Core Components/Risk Factors/Patient Goals Review:   Goals and Risk Factor Review     Row Name 06/09/22 1558             Core Components/Risk Factors/Patient Goals Review   Personal Goals Review Weight Management/Obesity;Diabetes;Hypertension       Review WayneAntionooing well with his weight, but feels he could benefit from losing a little bit. He reports that he has been checking his blood sugar levels at home and they are staying within normal ranges. He has not been checking his BP at home because he does not have a BP cuff. He is going to look into getting a cuff for his home.       Expected Outcomes Short: Start to monitor BP at home. Long: Continue to manage lifestyle risk factors.                Core Components/Risk Factors/Patient Goals at Discharge (Final Review):   Goals and Risk Factor Review - 06/09/22 1558       Core Components/Risk Factors/Patient Goals Review   Personal Goals Review Weight Management/Obesity;Diabetes;Hypertension    Review WayneAdynoing well with his weight,  but feels he could benefit from losing a little bit. He reports that he has been checking his blood sugar levels at home and they are staying within normal ranges. He has not been checking his BP at home because he does not have a BP cuff. He is going to look into getting a cuff for his home.    Expected Outcomes Short: Start to monitor BP at home. Long: Continue to manage lifestyle risk factors.             ITP Comments:  ITP Comments     Row Name 04/25/22 1035 05/04/22 1459 05/05/22 1607 06/01/22 0959 06/29/22 0839   ITP Comments Virtual Visit completed. Patient informed on EP and RD appointment and 6 Minute walk test. Patient also informed of patient health questionnaires on My Chart.  Patient Verbalizes understanding. Visit diagnosis can be found in [facility] 02/14/2022. Completed 6MWT and gym orientation. Initial ITP created and sent for review to Dr. Emily Filbert, Medical Director. First full day of exercise!  Patient was oriented to gym and equipment including functions, settings, policies, and procedures.  Patient's individual exercise prescription and treatment plan were reviewed.  All starting workloads were established based on the results of the 6 minute walk test done at initial orientation visit.  The plan for exercise progression was also introduced and progression will be customized based on patient's performance and goals. 30 Day review completed. Medical Director ITP review done, changes made as directed, and signed approval by Medical Director.   NEW TO PROGRAM 30 Day review completed. Medical Director ITP review done, changes made as directed, and signed approval by Medical Director.            Comments:

## 2022-06-30 ENCOUNTER — Encounter: Payer: No Typology Code available for payment source | Admitting: *Deleted

## 2022-06-30 DIAGNOSIS — Z952 Presence of prosthetic heart valve: Secondary | ICD-10-CM | POA: Diagnosis not present

## 2022-06-30 NOTE — Progress Notes (Signed)
Daily Session Note  Patient Details  Name: PERSHING SKIDMORE MRN: [id number] Date of Birth: June 28, 1941 Referring Provider:   Flowsheet Row Cardiac Rehab from 05/04/2022 in [facility] Cardiac and Pulmonary Rehab  Referring Provider Thakkar       Encounter Date: 06/30/2022  Check In:  Session Check In - 06/30/22 1546       Check-In   Supervising physician immediately available to respond to emergencies See telemetry face sheet for immediately available ER MD    Location [facility]-Cardiac & Pulmonary Rehab    Staff Present Renita Papa, RN BSN;Joseph Vienna, RCP,RRT,BSRT;Noah Tickle, BS, Exercise Physiologist;Deangleo Passage [place], [place], RCEP, CCRP, CCET    Virtual Visit No    Medication changes reported     No    Fall or balance concerns reported    No    Warm-up and Cool-down Performed on first and last piece of equipment    Resistance Training Performed Yes    VAD Patient? No    PAD/SET Patient? No      Pain Assessment   Currently in Pain? No/denies                Social History   Tobacco Use  Smoking Status Former   Packs/day: 0.25   Years: 20.00   Total pack years: 5.00   Types: Cigarettes   Quit date: 04/01/1974   Years since quitting: 48.2  Smokeless Tobacco Never    Goals Met:  Proper associated with RPD/PD & O2 Sat Independence with exercise equipment Using PLB without cueing & demonstrates good technique Exercise tolerated well No report of concerns or symptoms today Strength training completed today  Goals Unmet:  Not Applicable  Comments: Pt able to follow exercise prescription today without complaint.  Will continue to monitor for progression.    Dr. Emily Filbert is Medical Director for [place].  Dr. Ottie Glazier is Medical Director for [facility] Pulmonary Rehabilitation.

## 2022-07-04 ENCOUNTER — Encounter: Payer: No Typology Code available for payment source | Attending: Physician Assistant | Admitting: *Deleted

## 2022-07-04 DIAGNOSIS — Z952 Presence of prosthetic heart valve: Secondary | ICD-10-CM | POA: Diagnosis not present

## 2022-07-04 DIAGNOSIS — Z48812 Encounter for surgical aftercare following surgery on the circulatory system: Secondary | ICD-10-CM | POA: Diagnosis not present

## 2022-07-04 NOTE — Progress Notes (Signed)
Daily Session Note  Patient Details  Name: Dustin Lara MRN: [id number] Date of Birth: 12-07-1940 Referring Provider:   Flowsheet Row Cardiac Rehab from 05/04/2022 in [facility] Cardiac and Pulmonary Rehab  Referring Provider Thakkar       Encounter Date: 07/04/2022  Check In:  Session Check In - 07/04/22 1531       Check-In   Supervising physician immediately available to respond to emergencies See telemetry face sheet for immediately available ER MD    Location [facility]-Cardiac & Pulmonary Rehab    Staff Present Darlyne Russian, RN, Dimple Nanas, BS, Exercise Physiologist;Joseph Tessie Fass, [place]    Virtual Visit No    Medication changes reported     No    Fall or balance concerns reported    No    Warm-up and Cool-down Performed on first and last piece of equipment    Resistance Training Performed Yes    VAD Patient? No    PAD/SET Patient? No      Pain Assessment   Currently in Pain? No/denies                Social History   Tobacco Use  Smoking Status Former   Packs/day: 0.25   Years: 20.00   Total pack years: 5.00   Types: Cigarettes   Quit date: 04/01/1974   Years since quitting: 48.2  Smokeless Tobacco Never    Goals Met:  Independence with exercise equipment Exercise tolerated well No report of concerns or symptoms today Strength training completed today  Goals Unmet:  Not Applicable  Comments: Pt able to follow exercise prescription today without complaint.  Will continue to monitor for progression.    Dr. Emily Filbert is Medical Director for [place].  Dr. Ottie Glazier is Medical Director for [facility] Pulmonary Rehabilitation.

## 2022-07-06 ENCOUNTER — Encounter: Payer: No Typology Code available for payment source | Admitting: *Deleted

## 2022-07-06 DIAGNOSIS — Z952 Presence of prosthetic heart valve: Secondary | ICD-10-CM

## 2022-07-06 NOTE — Progress Notes (Signed)
Daily Session Note  Patient Details  Name: ELMA LIMAS MRN: [id number] Date of Birth: 1940/08/31 Referring Provider:   Flowsheet Row Cardiac Rehab from 05/04/2022 in [facility] Cardiac and Pulmonary Rehab  Referring Provider Thakkar       Encounter Date: 07/06/2022  Check In:  Session Check In - 07/06/22 1532       Check-In   Supervising physician immediately available to respond to emergencies See telemetry face sheet for immediately available ER MD    Location [facility]-Cardiac & Pulmonary Rehab    Staff Present Renita Papa, RN Moises Blood, BS, ACSM CEP, Exercise Physiologist;Joseph Tessie Fass, [place]    Virtual Visit No    Medication changes reported     No    Fall or balance concerns reported    No    Warm-up and Cool-down Performed on first and last piece of equipment    Resistance Training Performed Yes    VAD Patient? No    PAD/SET Patient? No      Pain Assessment   Currently in Pain? No/denies                Social History   Tobacco Use  Smoking Status Former   Packs/day: 0.25   Years: 20.00   Total pack years: 5.00   Types: Cigarettes   Quit date: 04/01/1974   Years since quitting: 48.2  Smokeless Tobacco Never    Goals Met:  Independence with exercise equipment Exercise tolerated well No report of concerns or symptoms today Strength training completed today  Goals Unmet:  Not Applicable  Comments: Pt able to follow exercise prescription today without complaint.  Will continue to monitor for progression.    Dr. Emily Filbert is Medical Director for [place].  Dr. Ottie Glazier is Medical Director for [facility] Pulmonary Rehabilitation.

## 2022-07-07 ENCOUNTER — Encounter: Payer: No Typology Code available for payment source | Admitting: *Deleted

## 2022-07-07 DIAGNOSIS — Z952 Presence of prosthetic heart valve: Secondary | ICD-10-CM

## 2022-07-07 NOTE — Progress Notes (Signed)
Daily Session Note  Patient Details  Name: SHANNAN GARFINKEL MRN: [id number] Date of Birth: November 21, 1940 Referring Provider:   Flowsheet Row Cardiac Rehab from 05/04/2022 in [facility] Cardiac and Pulmonary Rehab  Referring Provider Thakkar       Encounter Date: 07/07/2022  Check In:  Session Check In - 07/07/22 1543       Check-In   Supervising physician immediately available to respond to emergencies See telemetry face sheet for immediately available ER MD    Location [facility]-Cardiac & Pulmonary Rehab    Staff Present Justin Mend, Lorre Nick, MA, RCEP, CCRP, CCET;Lanora Reveron Sherryll Burger, RN BSN    Virtual Visit No    Medication changes reported     No    Fall or balance concerns reported    No    Warm-up and Cool-down Performed on first and last piece of equipment    Resistance Training Performed Yes    VAD Patient? No    PAD/SET Patient? No      Pain Assessment   Currently in Pain? No/denies                Social History   Tobacco Use  Smoking Status Former   Packs/day: 0.25   Years: 20.00   Total pack years: 5.00   Types: Cigarettes   Quit date: 04/01/1974   Years since quitting: 48.2  Smokeless Tobacco Never    Goals Met:  Independence with exercise equipment Exercise tolerated well No report of concerns or symptoms today Strength training completed today  Goals Unmet:  Not Applicable  Comments: Pt able to follow exercise prescription today without complaint.  Will continue to monitor for progression.    Dr. Emily Filbert is Medical Director for [place].  Dr. Ottie Glazier is Medical Director for [facility] Pulmonary Rehabilitation.

## 2022-07-11 ENCOUNTER — Encounter: Payer: No Typology Code available for payment source | Admitting: *Deleted

## 2022-07-11 DIAGNOSIS — Z952 Presence of prosthetic heart valve: Secondary | ICD-10-CM | POA: Diagnosis not present

## 2022-07-11 NOTE — Progress Notes (Signed)
Daily Session Note  Patient Details  Name: Dustin Lara MRN: [id number] Date of Birth: Jul 29, 1941 Referring Provider:   Flowsheet Row Cardiac Rehab from 05/04/2022 in [facility] Cardiac and Pulmonary Rehab  Referring Provider Thakkar       Encounter Date: 07/11/2022  Check In:  Session Check In - 07/11/22 1553       Check-In   Supervising physician immediately available to respond to emergencies See telemetry face sheet for immediately available ER MD    Location [facility]-Cardiac & Pulmonary Rehab    Staff Present Renita Papa, RN BSN;Joseph [place], RCP,RRT,BSRT;Kelly Arispe, [place], ACSM CEP, Exercise Physiologist    Virtual Visit No    Medication changes reported     No    Fall or balance concerns reported    No    Warm-up and Cool-down Performed on first and last piece of equipment    Resistance Training Performed Yes    VAD Patient? No    PAD/SET Patient? No      Pain Assessment   Currently in Pain? No/denies                Social History   Tobacco Use  Smoking Status Former   Packs/day: 0.25   Years: 20.00   Total pack years: 5.00   Types: Cigarettes   Quit date: 04/01/1974   Years since quitting: 48.3  Smokeless Tobacco Never    Goals Met:  Independence with exercise equipment Exercise tolerated well No report of concerns or symptoms today Strength training completed today  Goals Unmet:  Not Applicable  Comments: Pt able to follow exercise prescription today without complaint.  Will continue to monitor for progression.    Dr. Emily Filbert is Medical Director for [place].  Dr. Ottie Glazier is Medical Director for [facility] Pulmonary Rehabilitation.

## 2022-07-14 ENCOUNTER — Telehealth: Payer: Self-pay

## 2022-07-14 NOTE — Telephone Encounter (Signed)
Patient out sick, deemed a cold per patient as he got checked out this morning. May return to rehab once symptom free.

## 2022-07-18 ENCOUNTER — Encounter: Payer: No Typology Code available for payment source | Admitting: *Deleted

## 2022-07-18 DIAGNOSIS — Z952 Presence of prosthetic heart valve: Secondary | ICD-10-CM

## 2022-07-18 NOTE — Progress Notes (Signed)
Daily Session Note  Patient Details  Name: Dustin Lara MRN: [id number] Date of Birth: 03-Apr-1941 Referring Provider:   Flowsheet Row Cardiac Rehab from 05/04/2022 in [facility] Cardiac and Pulmonary Rehab  Referring Provider Thakkar       Encounter Date: 07/18/2022  Check In:  Session Check In - 07/18/22 1557       Check-In   Supervising physician immediately available to respond to emergencies See telemetry face sheet for immediately available ER MD    Location [facility]-Cardiac & Pulmonary Rehab    Staff Present Renita Papa, RN BSN;Joseph Tessie Fass, RCP,RRT,BSRT;Noah [place], [place], Exercise Physiologist    Virtual Visit No    Medication changes reported     No    Fall or balance concerns reported    No    Warm-up and Cool-down Performed on first and last piece of equipment    Resistance Training Performed Yes    VAD Patient? No    PAD/SET Patient? No      Pain Assessment   Currently in Pain? No/denies                Social History   Tobacco Use  Smoking Status Former   Packs/day: 0.25   Years: 20.00   Total pack years: 5.00   Types: Cigarettes   Quit date: 04/01/1974   Years since quitting: 48.3  Smokeless Tobacco Never    Goals Met:  Independence with exercise equipment Exercise tolerated well No report of concerns or symptoms today Strength training completed today  Goals Unmet:  Not Applicable  Comments: Pt able to follow exercise prescription today without complaint.  Will continue to monitor for progression.    Dr. Emily Filbert is Medical Director for [place].  Dr. Ottie Glazier is Medical Director for [facility] Pulmonary Rehabilitation.

## 2022-07-20 ENCOUNTER — Encounter: Payer: No Typology Code available for payment source | Admitting: *Deleted

## 2022-07-20 VITALS — Ht 66.4 in | Wt 188.0 lb

## 2022-07-20 DIAGNOSIS — Z952 Presence of prosthetic heart valve: Secondary | ICD-10-CM

## 2022-07-20 NOTE — Progress Notes (Signed)
Daily Session Note  Patient Details  Name: Dustin Lara MRN: [id number] Date of Birth: Feb 03, 1941 Referring Provider:   Flowsheet Row Cardiac Rehab from 05/04/2022 in [facility] Cardiac and Pulmonary Rehab  Referring Provider Thakkar       Encounter Date: 07/20/2022  Check In:  Session Check In - 07/20/22 1651       Check-In   Supervising physician immediately available to respond to emergencies See telemetry face sheet for immediately available ER MD    Location [facility]-Cardiac & Pulmonary Rehab    Staff Present Renita Papa, RN Moises Blood, BS, ACSM CEP, Exercise Physiologist;Joseph Tessie Fass, [place]    Virtual Visit No    Medication changes reported     No    Fall or balance concerns reported    No    Warm-up and Cool-down Performed on first and last piece of equipment    Resistance Training Performed Yes    VAD Patient? No    PAD/SET Patient? No      Pain Assessment   Currently in Pain? No/denies                Social History   Tobacco Use  Smoking Status Former   Packs/day: 0.25   Years: 20.00   Total pack years: 5.00   Types: Cigarettes   Quit date: 04/01/1974   Years since quitting: 48.3  Smokeless Tobacco Never    Goals Met:  Independence with exercise equipment Exercise tolerated well No report of concerns or symptoms today Strength training completed today  Goals Unmet:  Not Applicable  Comments: Pt able to follow exercise prescription today without complaint.  Will continue to monitor for progression.    Dr. Emily Filbert is Medical Director for [place].  Dr. Ottie Glazier is Medical Director for [facility] Pulmonary Rehabilitation.

## 2022-07-20 NOTE — Patient Instructions (Addendum)
Discharge Patient Instructions  Patient Details  Name: Dustin Lara MRN: [id number] Date of Birth: 04-28-1941 Referring Provider:  [facility], [place]   Number of Visits: 36  Reason for Discharge:  Patient reached a stable level of exercise. Patient independent in their exercise. Patient has met program and personal goals.  Diagnosis:  S/P TAVR (transcatheter aortic valve replacement)  Initial Exercise Prescription:  Initial Exercise Prescription - 05/04/22 1500       Date of Initial Exercise RX and Referring Provider   Date 05/04/22    Referring Provider Thakkar      Oxygen   Maintain Oxygen Saturation 88% or higher      Treadmill   MPH 1.8    Grade 0.5    Minutes 15    METs 2.5      Recumbant Bike   Level 1    RPM 50    Watts 12    Minutes 15    METs 1.55      NuStep   Level 2    SPM 80    Minutes 15    METs 1.55      T5 Nustep   Level 1    SPM 80    Minutes 15    METs 1.55      Prescription Details   Frequency (times per week) 3    Duration Progress to 30 minutes of continuous aerobic without signs/symptoms of physical distress      Intensity   THRR 40-80% of Max Heartrate 93-123    Ratings of Perceived Exertion 11-13    Perceived Dyspnea 0-4      Progression   Progression Continue to progress workloads to maintain intensity without signs/symptoms of physical distress.      Resistance Training   Training Prescription Yes    Weight 3 lb    Reps 10-15             Discharge Exercise Prescription (Final Exercise Prescription Changes):  Exercise Prescription Changes - 07/13/22 1200       Response to Exercise   Blood Pressure (Admit) 122/60    Blood Pressure (Exit) 122/60    Heart Rate (Admit) 84 bpm    Heart Rate (Exercise) 118 bpm    Heart Rate (Exit) 77 bpm    Rating of Perceived Exertion (Exercise) 13    Symptoms none    Duration Continue with 30 min of aerobic exercise without signs/symptoms of physical distress.     Intensity THRR unchanged      Progression   Progression Continue to progress workloads to maintain intensity without signs/symptoms of physical distress.    Average METs 2.91      Resistance Training   Training Prescription Yes    Weight 3 lb    Reps 10-15      Interval Training   Interval Training No      Treadmill   MPH 2    Grade 1    Minutes 15    METs 2.81      Recumbant Bike   Level 3    Watts 25    Minutes 15    METs 2.8      NuStep   Level 4    Minutes 15    METs 3.1      Home Exercise Plan   Plans to continue exercise at Home (comment)   Walking and bands at home; considering joining [employer]   Frequency Add 2 additional days to program  exercise sessions.    Initial Home Exercises Provided 05/26/22      Oxygen   Maintain Oxygen Saturation 88% or higher             Functional Capacity:  6 Minute Walk     Row Name 05/04/22 1504 07/20/22 1554       6 Minute Walk   Phase Initial Discharge    Distance 880 feet 1395 feet    Distance % Change -- 58.5 %    Distance Feet Change -- 515 ft    Walk Time 6 minutes 6 minutes    # of Rest Breaks 0 0    MPH 1.67 2.64    METS 1.55 2.46    RPE 11 12    Perceived Dyspnea  1 0    VO2 Peak 5.43 8.61    Symptoms Yes (comment) No    Comments Chest Tightness, SOB --    Resting HR 64 bpm 62 bpm    Resting BP 128/56 120/62    Resting Oxygen Saturation  97 % 98 %    Exercise Oxygen Saturation  during 6 min walk 99 % 99 %    Max Ex. HR 103 bpm 110 bpm    Max Ex. BP 148/62 144/66    2 Minute Post BP 136/66 --            Nutrition & Weight - Outcomes:  Pre Biometrics - 05/04/22 1517       Pre Biometrics   Height 5' 6.4" (1.687 m)    Weight 184 lb 6.4 oz (83.6 kg)    Waist Circumference 41 inches    Hip Circumference 41.5 inches    Waist to Hip Ratio 0.99 %    BMI (Calculated) 29.39    Single Leg Stand 5.45 seconds   L            Post Biometrics - 07/20/22 1555        Post  Biometrics    Height 5' 6.4" (1.687 m)    Weight 188 lb (85.3 kg)    Waist Circumference 40.5 inches    Hip Circumference 39.5 inches    Waist to Hip Ratio 1.03 %    BMI (Calculated) 29.96    Single Leg Stand 7.96 seconds   L            Nutrition:  Nutrition Therapy & Goals - 05/04/22 1445       Nutrition Therapy   Diet Heart healthy, low Na, T2DM MNT    Drug/Food Interactions Statins/Certain Fruits    Protein (specify units) 90-95g    Fiber 25 grams    Whole Grain Foods 3 servings    Saturated Fats 14 max. grams    Fruits and Vegetables 8 servings/day    Sodium 2 grams      Personal Nutrition Goals   Nutrition Goal ST: add in protein rich snack, add non-starchy vegetables and fruit to meals LT: increase fruit and vegetable intake to at least 5 per day, limit sodium <2g/day, meet energy and protein needs    Comments He chooses whole wheat bread instead of white bread. Food recall: B: bowl of cereal (frosted wheat as well as another cereal with pecans and freeze dried strawberries, 2% milk) or bacon/sausage and eggs (tears up whole wheat bread to eat it). Sometimes won't eat anything in the morning. S: sometimes pack of nabs D: tonight he will have hot dogs with cheese (1x/week), homemade tacos, sandwich (whole  wheat bread), [employer]. He goes out to eat 2x/week. Drinks: diet soda (1-3) or flavored water, water. He reports his BG has been running higher than normal. he reports his breathing has improved overall since being in the hospital. Discussed general heart healthy eating and reviewed T2DM MNT as well as pulmnary MNT. Encouraged to start with adding in additional non-starchy vegetables and to add in a snack mid-day with protein as he is not eating lunch most days; boiled egg, greek yogurt, peanut butter - he reports enjoying peanut butter and banana sandwiches so he may have that as well. So that he doesn't need to cook additional food, suggested he could use frozen or canned  fruits/vegetables; discussed sodium and sugar considerations with canned foods.      Intervention Plan   Intervention Prescribe, educate and counsel regarding individualized specific dietary modifications aiming towards targeted core components such as weight, hypertension, lipid management, diabetes, heart failure and other comorbidities.;Nutrition handout(s) given to patient.    Expected Outcomes Short Term Goal: Understand basic principles of dietary content, such as calories, fat, sodium, cholesterol and nutrients.;Short Term Goal: A plan has been developed with personal nutrition goals set during dietitian appointment.;Long Term Goal: Adherence to prescribed nutrition plan.

## 2022-07-21 ENCOUNTER — Encounter: Payer: No Typology Code available for payment source | Admitting: *Deleted

## 2022-07-21 DIAGNOSIS — Z952 Presence of prosthetic heart valve: Secondary | ICD-10-CM

## 2022-07-21 NOTE — Progress Notes (Signed)
Daily Session Note  Patient Details  Name: Dustin Lara MRN: [id number] Date of Birth: 10/08/1940 Referring Provider:   Flowsheet Row Cardiac Rehab from 05/04/2022 in [facility] Cardiac and Pulmonary Rehab  Referring Provider Thakkar       Encounter Date: 07/21/2022  Check In:  Session Check In - 07/21/22 1538       Check-In   Supervising physician immediately available to respond to emergencies See telemetry face sheet for immediately available ER MD    Location [facility]-Cardiac & Pulmonary Rehab    Staff Present Renita Papa, RN BSN;Susanne Bice, RN, BSN, CCRP;Joseph [place], RCP,RRT,BSRT    Virtual Visit No    Medication changes reported     No    Fall or balance concerns reported    No    Warm-up and Cool-down Performed on first and last piece of equipment    Resistance Training Performed Yes    VAD Patient? No    PAD/SET Patient? No      Pain Assessment   Currently in Pain? No/denies                Social History   Tobacco Use  Smoking Status Former   Packs/day: 0.25   Years: 20.00   Total pack years: 5.00   Types: Cigarettes   Quit date: 04/01/1974   Years since quitting: 48.3  Smokeless Tobacco Never    Goals Met:  Independence with exercise equipment Exercise tolerated well No report of concerns or symptoms today Strength training completed today  Goals Unmet:  Not Applicable  Comments: Pt able to follow exercise prescription today without complaint.  Will continue to monitor for progression.    Dr. Emily Filbert is Medical Director for [place].  Dr. Ottie Glazier is Medical Director for [facility] Pulmonary Rehabilitation.

## 2022-07-27 ENCOUNTER — Encounter: Payer: Self-pay | Admitting: *Deleted

## 2022-07-27 ENCOUNTER — Encounter: Payer: No Typology Code available for payment source | Attending: Physician Assistant

## 2022-07-27 DIAGNOSIS — Z952 Presence of prosthetic heart valve: Secondary | ICD-10-CM | POA: Insufficient documentation

## 2022-07-27 DIAGNOSIS — Z48812 Encounter for surgical aftercare following surgery on the circulatory system: Secondary | ICD-10-CM | POA: Insufficient documentation

## 2022-07-27 NOTE — Progress Notes (Signed)
Cardiac Individual Treatment Plan  Patient Details  Name: Dustin Lara MRN: [id number] Date of Birth: 20-Apr-1941 Referring Provider:   Flowsheet Row Cardiac Rehab from 05/04/2022 in [facility] Cardiac and Pulmonary Rehab  Referring Provider Thakkar       Initial Encounter Date:  Flowsheet Row Cardiac Rehab from 05/04/2022 in [facility] Cardiac and Pulmonary Rehab  Date 05/04/22       Visit Diagnosis: S/P TAVR (transcatheter aortic valve replacement)  Patient's Home Medications on Admission:  Current Outpatient Medications:    acetaminophen (TYLENOL) 325 MG tablet, TAKE TWO TABLETS BY MOUTH THREE TIMES A DAY AS NEEDED, Disp: , Rfl:    albuterol (VENTOLIN HFA) 108 (90 Base) MCG/ACT inhaler, INHALE 2 PUFFS BY ORAL INHALATION 3 TIMES A DAY AS NEEDED FOR BREATHING. BE SURE TO [place] MOUTHPIECE WITH WARM WATER ONCE A WEEK, Disp: , Rfl:    Alogliptin Benzoate 6.25 MG TABS, TAKE ONE TABLET BY MOUTH EVERY MORNING FOR DIABETES, Disp: , Rfl:    amLODipine (NORVASC) 5 MG tablet, TAKE ONE-HALF TABLET BY MOUTH EVERY DAY ESSENTIAL HYPERTENSION FOR BLOOD PRESSURE, Disp: , Rfl:    ascorbic acid (VITAMIN C) 100 MG tablet, TAKE  BY MOUTH, Disp: , Rfl:    atorvastatin (LIPITOR) 20 MG tablet, TAKE ONE TABLET (20MG) BY MOUTH AT BEDTIME, Disp: , Rfl:    Calcium Carb-Cholecalciferol 500-10 MG-MCG TABS, Take 1 tablet by mouth daily., Disp: , Rfl:    Calcium Carbonate-Vitamin D 250-3.125 MG-MCG TABS, TAKE  BY MOUTH, Disp: , Rfl:    chlorthalidone (HYGROTON) 25 MG tablet, TAKE ONE-HALF TABLET BY MOUTH EVERY DAY FOR BLOOD PRESSURE *IMPORTANT STOP TAKING  HYDROCHLOROTHIZAIDE, Disp: , Rfl:    ferrous gluconate (FERGON) 324 MG tablet, Take 1 tablet by mouth every other day., Disp: , Rfl:    ferrous sulfate 325 (65 FE) MG EC tablet, Take by mouth., Disp: , Rfl:    finasteride (PROSCAR) 5 MG tablet, Take 1 tablet by mouth daily., Disp: , Rfl:    fluticasone-salmeterol (ADVAIR) 100-50 MCG/ACT AEPB, INHALE 1 PUFF BY MOUTH EVERY  12 HOURS, Disp: , Rfl:    glipiZIDE (GLUCOTROL) 10 MG tablet, TAKE ONE TABLET BY MOUTH TWO TIMES A DAY . DOSE INCREASE, Disp: , Rfl:    glucose blood (PRECISION QID TEST) test strip, Use 1 strip twice a week, Disp: , Rfl:    ketoconazole (NIZORAL) 2 % shampoo, SHAMPOO AS DIRECTED TOPICALLY TUESDAY,THURSDAY,SATURDAY AS NEEDED, Disp: , Rfl:    losartan (COZAAR) 50 MG tablet, TAKE ONE TABLET BY MOUTH EVERY DAY ESSENTIAL HYPERTENSION, Disp: , Rfl:    polyethylene glycol (MIRALAX / GLYCOLAX) 17 g packet, Take by mouth., Disp: , Rfl:    senna-docusate (SENOKOT-S) 8.6-50 MG tablet, Take by mouth., Disp: , Rfl:    tamsulosin (FLOMAX) 0.4 MG CAPS capsule, Take 1 capsule by mouth every evening., Disp: , Rfl:    Tiotropium Bromide Monohydrate 2.5 MCG/ACT AERS, INHALE 2 INHALATIONS BY ORAL INHALATION EVERY DAY, Disp: , Rfl:    traZODone (DESYREL) 50 MG tablet, TAKE ONE AND ONE-HALF TABLETS BY MOUTH AT BEDTIME FOR SLEEP AND MOOD, Disp: , Rfl:   Past Medical History: Past Medical History:  Diagnosis Date   Hypertension     Tobacco Use: Social History   Tobacco Use  Smoking Status Former   Packs/day: 0.25   Years: 20.00   Total pack years: 5.00   Types: Cigarettes   Quit date: 04/01/1974   Years since quitting: 48.3  Smokeless Tobacco Never  Labs: Review Flowsheet        No data to display           Exercise Target Goals: Exercise Program Goal: Individual exercise prescription set using results from initial 6 min walk test and THRR while considering  patient's activity barriers and safety.   Exercise Prescription Goal: Initial exercise prescription builds to 30-45 minutes a day of aerobic activity, 2-3 days per week.  Home exercise guidelines will be given to patient during program as part of exercise prescription that the participant will acknowledge.   Education: Aerobic Exercise: - Group verbal and visual presentation on the components of exercise prescription. Introduces  F.I.T.T principle from ACSM for exercise prescriptions.  Reviews F.I.T.T. principles of aerobic exercise including progression. Written material given at graduation. Flowsheet Row Cardiac Rehab from 06/22/2022 in [facility] Cardiac and Pulmonary Rehab  Education need identified 05/04/22       Education: Resistance Exercise: - Group verbal and visual presentation on the components of exercise prescription. Introduces F.I.T.T principle from ACSM for exercise prescriptions  Reviews F.I.T.T. principles of resistance exercise including progression. Written material given at graduation. Flowsheet Row Cardiac Rehab from 06/22/2022 in [facility] Cardiac and Pulmonary Rehab  Date 06/22/22  Educator KW  Instruction Review Code 1- [employer] Understanding        Education: Exercise & Equipment Safety: - Individual verbal instruction and demonstration of equipment use and safety with use of the equipment. Flowsheet Row Cardiac Rehab from 06/22/2022 in [facility] Cardiac and Pulmonary Rehab  Date 04/25/22  Educator [facility]  Instruction Review Code 1- Verbalizes Understanding       Education: Exercise Physiology & General Exercise Guidelines: - Group verbal and written instruction with models to review the exercise physiology of the cardiovascular system and associated critical values. Provides general exercise guidelines with specific guidelines to those with heart or lung disease.    Education: Flexibility, Balance, Mind/Body Relaxation: - Group verbal and visual presentation with interactive activity on the components of exercise prescription. Introduces F.I.T.T principle from ACSM for exercise prescriptions. Reviews F.I.T.T. principles of flexibility and balance exercise training including progression. Also discusses the mind body connection.  Reviews various relaxation techniques to help reduce and manage stress (i.e. Deep breathing, progressive muscle relaxation, and visualization). Balance handout provided to take  home. Written material given at graduation. Flowsheet Row Cardiac Rehab from 06/22/2022 in [facility] Cardiac and Pulmonary Rehab  Date 06/22/22  Educator KW  Instruction Review Code 1- Verbalizes Understanding       Activity Barriers & Risk Stratification:  Activity Barriers & Cardiac Risk Stratification - 05/04/22 1505       Activity Barriers & Cardiac Risk Stratification   Activity Barriers Muscular Weakness;Deconditioning;Shortness of Breath    Cardiac Risk Stratification High             6 Minute Walk:  6 Minute Walk     Row Name 05/04/22 1504 07/20/22 1554       6 Minute Walk   Phase Initial Discharge    Distance 880 feet 1395 feet    Distance % Change -- 58.5 %    Distance Feet Change -- 515 ft    Walk Time 6 minutes 6 minutes    # of Rest Breaks 0 0    MPH 1.67 2.64    METS 1.55 2.46    RPE 11 12    Perceived Dyspnea  1 0    VO2 Peak 5.43 8.61    Symptoms Yes (comment) No  Comments Chest Tightness, SOB --    Resting HR 64 bpm 62 bpm    Resting BP 128/56 120/62    Resting Oxygen Saturation  97 % 98 %    Exercise Oxygen Saturation  during 6 min walk 99 % 99 %    Max Ex. HR 103 bpm 110 bpm    Max Ex. BP 148/62 144/66    2 Minute Post BP 136/66 --             Oxygen Initial Assessment:   Oxygen Re-Evaluation:   Oxygen Discharge (Final Oxygen Re-Evaluation):   Initial Exercise Prescription:  Initial Exercise Prescription - 05/04/22 1500       Date of Initial Exercise RX and Referring Provider   Date 05/04/22    Referring Provider Thakkar      Oxygen   Maintain Oxygen Saturation 88% or higher      Treadmill   MPH 1.8    Grade 0.5    Minutes 15    METs 2.5      Recumbant Bike   Level 1    RPM 50    Watts 12    Minutes 15    METs 1.55      NuStep   Level 2    SPM 80    Minutes 15    METs 1.55      T5 Nustep   Level 1    SPM 80    Minutes 15    METs 1.55      Prescription Details   Frequency (times per week) 3     Duration Progress to 30 minutes of continuous aerobic without signs/symptoms of physical distress      Intensity   THRR 40-80% of Max Heartrate 93-123    Ratings of Perceived Exertion 11-13    Perceived Dyspnea 0-4      Progression   Progression Continue to progress workloads to maintain intensity without signs/symptoms of physical distress.      Resistance Training   Training Prescription Yes    Weight 3 lb    Reps 10-15             Perform Capillary Blood Glucose checks as needed.  Exercise Prescription Changes:   Exercise Prescription Changes     Row Name 05/04/22 1500 05/16/22 1300 05/26/22 1500 05/31/22 1400 06/14/22 1500     Response to Exercise   Blood Pressure (Admit) 128/56 104/64 -- 134/66 134/64   Blood Pressure (Exercise) 148/62 124/64 -- 140/77 --   Blood Pressure (Exit) 136/66 120/60 -- 124/56 126/54   Heart Rate (Admit) 64 bpm 8 bpm -- 76 bpm 86 bpm   Heart Rate (Exercise) 103 bpm 133 bpm -- 147 bpm 138 bpm   Heart Rate (Exit) 59 bpm 95 bpm -- 111 bpm 94 bpm   Oxygen Saturation (Admit) 97 % -- -- -- --   Oxygen Saturation (Exercise) 99 % -- -- -- --   Rating of Perceived Exertion (Exercise) 11 15 -- 13 12   Perceived Dyspnea (Exercise) 1 -- -- -- --   Symptoms chest tightness, SOB none -- none none   Comments 6MWT Results 3rd full day oof exercise -- -- --   Duration -- Progress to 30 minutes of  aerobic without signs/symptoms of physical distress -- Continue with 30 min of aerobic exercise without signs/symptoms of physical distress. Continue with 30 min of aerobic exercise without signs/symptoms of physical distress.   Intensity -- THRR unchanged -- THRR unchanged  THRR unchanged     Progression   Progression -- Continue to progress workloads to maintain intensity without signs/symptoms of physical distress. -- Continue to progress workloads to maintain intensity without signs/symptoms of physical distress. Continue to progress workloads to maintain  intensity without signs/symptoms of physical distress.   Average METs -- 2.46 -- 3.12 2.99     Resistance Training   Training Prescription -- Yes -- Yes Yes   Weight -- 3 lb -- 3 lb 3 lb   Reps -- 10-15 -- 10-15 10-15     Interval Training   Interval Training -- No -- No No     Treadmill   MPH -- 2 -- 2.5 2.5   Grade -- 0.5 -- 3 1.5   Minutes -- 15 -- 15 15   METs -- 2.67 -- 3.95 3.43     Recumbant Bike   Level -- 2 -- 3 --   Watts -- 25 -- 23 --   Minutes -- 15 -- 15 --   METs -- 2.9 -- -- --     NuStep   Level -- 3 -- 3 3   Minutes -- 15 -- 15 15   METs -- 2.4 -- 3 3     REL-XR   Level -- -- -- -- 1   Minutes -- -- -- -- 15   METs -- -- -- -- 2.2     Home Exercise Plan   Plans to continue exercise at -- -- Home (comment)  Walking and bands at home; considering joining [employer] Home (comment)  Walking and bands at home; considering joining [employer] Home (comment)  Walking and bands at home; considering joining [employer]   Frequency -- -- Add 2 additional days to program exercise sessions. Add 2 additional days to program exercise sessions. Add 2 additional days to program exercise sessions.   Initial Home Exercises Provided -- -- 05/26/22 05/26/22 05/26/22     Oxygen   Maintain Oxygen Saturation -- 88% or higher 88% or higher 88% or higher 88% or higher    Row Name 06/29/22 1400 07/13/22 1200           Response to Exercise   Blood Pressure (Admit) 132/74 122/60      Blood Pressure (Exit) 108/60 122/60      Heart Rate (Admit) 80 bpm 84 bpm      Heart Rate (Exercise) 125 bpm 118 bpm      Heart Rate (Exit) 88 bpm 77 bpm      Rating of Perceived Exertion (Exercise) 14 13      Symptoms none none      Duration Continue with 30 min of aerobic exercise without signs/symptoms of physical distress. Continue with 30 min of aerobic exercise without signs/symptoms of physical distress.      Intensity THRR unchanged THRR unchanged        Progression    Progression Continue to progress workloads to maintain intensity without signs/symptoms of physical distress. Continue to progress workloads to maintain intensity without signs/symptoms of physical distress.      Average METs 2.99 2.91        Resistance Training   Training Prescription Yes Yes      Weight 3 lb 3 lb      Reps 10-15 10-15        Interval Training   Interval Training No No        Treadmill   MPH 2.5 2  Grade 1.5 1      Minutes 15 15      METs 3.43 2.81        Recumbant Bike   Level -- 3      Watts -- 25      Minutes -- 15      METs -- 2.8        NuStep   Level 4 4      Minutes 15 15      METs 3.2 3.1        Home Exercise Plan   Plans to continue exercise at Home (comment)  Walking and bands at home; considering joining [employer] Home (comment)  Walking and bands at home; considering joining [employer]      Frequency Add 2 additional days to program exercise sessions. Add 2 additional days to program exercise sessions.      Initial Home Exercises Provided 05/26/22 05/26/22        Oxygen   Maintain Oxygen Saturation 88% or higher 88% or higher               Exercise Comments:   Exercise Comments     Row Name 05/05/22 1607           Exercise Comments First full day of exercise!  Patient was oriented to gym and equipment including functions, settings, policies, and procedures.  Patient's individual exercise prescription and treatment plan were reviewed.  All starting workloads were established based on the results of the 6 minute walk test done at initial orientation visit.  The plan for exercise progression was also introduced and progression will be customized based on patient's performance and goals.                Exercise Goals and Review:   Exercise Goals     Row Name 05/04/22 1517             Exercise Goals   Increase Physical Activity Yes       Intervention Provide advice, education, support and counseling about  physical activity/exercise needs.;Develop an individualized exercise prescription for aerobic and resistive training based on initial evaluation findings, risk stratification, comorbidities and participant's personal goals.       Expected Outcomes Short Term: Attend rehab on a regular basis to increase amount of physical activity.;Long Term: Add in home exercise to make exercise part of routine and to increase amount of physical activity.;Long Term: Exercising regularly at least 3-5 days a week.       Increase Strength and Stamina Yes       Intervention Develop an individualized exercise prescription for aerobic and resistive training based on initial evaluation findings, risk stratification, comorbidities and participant's personal goals.;Provide advice, education, support and counseling about physical activity/exercise needs.       Expected Outcomes Short Term: Increase workloads from initial exercise prescription for resistance, speed, and METs.;Short Term: Perform resistance training exercises routinely during rehab and add in resistance training at home;Long Term: Improve cardiorespiratory fitness, muscular endurance and strength as measured by increased METs and functional capacity (6MWT)       Able to understand and use rate of perceived exertion (RPE) scale Yes       Intervention Provide education and explanation on how to use RPE scale       Expected Outcomes Short Term: Able to use RPE daily in rehab to express subjective intensity level;Long Term:  Able to use RPE to guide intensity level when exercising independently  Able to understand and use Dyspnea scale Yes       Intervention Provide education and explanation on how to use Dyspnea scale       Expected Outcomes Long Term: Able to use Dyspnea scale to guide intensity level when exercising independently;Short Term: Able to use Dyspnea scale daily in rehab to express subjective sense of shortness of breath during exertion       Knowledge  and understanding of Target Heart Rate Range (THRR) Yes       Intervention Provide education and explanation of THRR including how the numbers were predicted and where they are located for reference       Expected Outcomes Short Term: Able to state/look up THRR;Long Term: Able to use THRR to govern intensity when exercising independently;Short Term: Able to use daily as guideline for intensity in rehab       Able to check pulse independently Yes       Intervention Provide education and demonstration on how to check pulse in carotid and radial arteries.;Review the importance of being able to check your own pulse for safety during independent exercise       Expected Outcomes Short Term: Able to explain why pulse checking is important during independent exercise;Long Term: Able to check pulse independently and accurately       Understanding of Exercise Prescription Yes       Intervention Provide education, explanation, and written materials on patient's individual exercise prescription       Expected Outcomes Short Term: Able to explain program exercise prescription;Long Term: Able to explain home exercise prescription to exercise independently                Exercise Goals Re-Evaluation :  Exercise Goals Re-Evaluation     Row Name 05/05/22 1607 05/16/22 1310 05/26/22 1600 05/31/22 1413 06/09/22 1543     Exercise Goal Re-Evaluation   Exercise Goals Review Knowledge and understanding of Target Heart Rate Range (THRR);Able to understand and use rate of perceived exertion (RPE) scale;Understanding of Exercise Prescription;Able to understand and use Dyspnea scale Increase Physical Activity;Increase Strength and Stamina;Understanding of Exercise Prescription Increase Physical Activity;Increase Strength and Stamina;Understanding of Exercise Prescription Increase Physical Activity;Increase Strength and Stamina;Understanding of Exercise Prescription Increase Physical Activity;Increase Strength and  Stamina;Understanding of Exercise Prescription   Comments Reviewed RPE and dyspnea scales, THR and program prescription with pt today.  Pt voiced understanding and was given a copy of goals to take home. Dustin Lara is off to a good start with rehab for the first couple of sessions he has been here. He is able to do his initial exercise prescription and is already up to level 3 on the T4 Nustep. We will contiue to monitor as he progresses. Reviewed home exercise with pt today.  Pt plans to walk and use resistance bands at home for exercise. He is also considering joining [employer]. Reviewed THR, pulse, RPE, sign and symptoms, pulse oximetery and when to call 911 or MD.  Also discussed weather considerations and indoor options.  Pt voiced understanding. Dustin Lara continues to do well in rehab. He recently improved his overall average MET level to 3.12 METs. He also increased his workload on the treadmill to 2.5 mph and an incline of 3%. He has tolerated 3 lb for hand weights as well. We will continue to monitor his progress in the program. Dustin Lara is doing well in rehab. He has been progressing with his workloads well. He states that he is feeling much better  since starting the program. Dustin Lara is also doing some walking at home a couple of times a week when it is warm outside. He also states that he purchased 5 lb hand weights for resistance training at home. We will continue to monitor his progress in the program.   Expected Outcomes Short: Use RPE daily to regulate intensity. Long: Follow program prescription in THR. Short: Continue curent exercise prescription and attend rehab Long: Improve overall strength and stamina Short: Begin to walk more on days away from rehab. Long: Continue to increase strength and stamina. Short: Continue to increase workloads as tolerated. Long: Continue to increase strength and stamina. Short: Continue to walk at home and begin using hand weights. Long: Continue to exercise independently.     Blaine Name 06/14/22 1613 06/29/22 1444 07/13/22 1205         Exercise Goal Re-Evaluation   Exercise Goals Review Increase Physical Activity;Increase Strength and Stamina;Understanding of Exercise Prescription Increase Physical Activity;Increase Strength and Stamina;Understanding of Exercise Prescription Increase Physical Activity;Increase Strength and Stamina;Understanding of Exercise Prescription     Comments Dustin Lara continues to do well in rehab. He has been at a consistent 2.5/1.5% workload on the treadmill, his HR has been getting a little high on the treadmill only, and will try to modify his workload to bring it down a bit- he is asymptomatic. He has been at level 1 on the XR and would benefit from increasing. Will continue to monitor. Dustin Lara is doing well in rehab. He has consistently worked at a MET level above 3 METs. He has also improved to level 4 on the T4. He has tolerated the treadmill at a speed of 2.5 mph and an incline of 1.5% as well. We will continue to monitor his progress in the program. Dustin Lara continues to do well in rehab.  He has been consistently at level 4 on the T4 Nustep.  He dropped his workload a little bit on the treadmill and staff will encourage to increase it back to his previous workload, and progress from there. He continues to reach his THR. He is due for his post 6MWT and we hope to see significant improvement. Will continue to monitor.     Expected Outcomes Short: Increase to level 2 on XR Long: Continue to increase overall MET level Short: Continue to increase the speed on the treadmill. Long: Continue to increase strength and stamina. Short: Improve on post 6MWT Long: Continue to increase overall MET level              Discharge Exercise Prescription (Final Exercise Prescription Changes):  Exercise Prescription Changes - 07/13/22 1200       Response to Exercise   Blood Pressure (Admit) 122/60    Blood Pressure (Exit) 122/60    Heart Rate (Admit) 84 bpm     Heart Rate (Exercise) 118 bpm    Heart Rate (Exit) 77 bpm    Rating of Perceived Exertion (Exercise) 13    Symptoms none    Duration Continue with 30 min of aerobic exercise without signs/symptoms of physical distress.    Intensity THRR unchanged      Progression   Progression Continue to progress workloads to maintain intensity without signs/symptoms of physical distress.    Average METs 2.91      Resistance Training   Training Prescription Yes    Weight 3 lb    Reps 10-15      Interval Training   Interval Training No  Treadmill   MPH 2    Grade 1    Minutes 15    METs 2.81      Recumbant Bike   Level 3    Watts 25    Minutes 15    METs 2.8      NuStep   Level 4    Minutes 15    METs 3.1      Home Exercise Plan   Plans to continue exercise at Home (comment)   Walking and bands at home; considering joining [employer]   Frequency Add 2 additional days to program exercise sessions.    Initial Home Exercises Provided 05/26/22      Oxygen   Maintain Oxygen Saturation 88% or higher             Nutrition:  Target Goals: Understanding of nutrition guidelines, daily intake of sodium <1573m, cholesterol <2089m calories 30% from fat and 7% or less from saturated fats, daily to have 5 or more servings of fruits and vegetables.  Education: All About Nutrition: -Group instruction provided by verbal, written material, interactive activities, discussions, models, and posters to present general guidelines for heart healthy nutrition including fat, fiber, MyPlate, the role of sodium in heart healthy nutrition, utilization of the nutrition label, and utilization of this knowledge for meal planning. Follow up email sent as well. Written material given at graduation. Flowsheet Row [facility]  Education need identified 05/04/22       Biometrics:  Pre Biometrics - 05/04/22 1517       Pre Biometrics    Height 5' 6.4" (1.687 m)    Weight 184 lb 6.4 oz (83.6 kg)    Waist Circumference 41 inches    Hip Circumference 41.5 inches    Waist to Hip Ratio 0.99 %    BMI (Calculated) 29.39    Single Leg Stand 5.45 seconds   L            Post Biometrics - 07/20/22 1555        Post  Biometrics   Height 5' 6.4" (1.687 m)    Weight 188 lb (85.3 kg)    Waist Circumference 40.5 inches    Hip Circumference 39.5 inches    Waist to Hip Ratio 1.03 %    BMI (Calculated) 29.96    Single Leg Stand 7.96 seconds   L            Nutrition Therapy Plan and Nutrition Goals:  Nutrition Therapy & Goals - 05/04/22 1445       Nutrition Therapy   Diet Heart healthy, low Na, T2DM MNT    Drug/Food Interactions Statins/Certain Fruits    Protein (specify units) 90-95g    Fiber 25 grams    Whole [employer]rain Foods 3 servings    Saturated Fats 14 max. grams    Fruits and Vegetables 8 servings/day    Sodium 2 grams      Personal Nutrition Goals   Nutrition Goal ST: add in protein rich snack, add non-starchy vegetables and fruit to meals LT: increase fruit and vegetable intake to at least 5 per day, limit sodium <2g/day, meet energy and protein needs    Comments He chooses whole wheat bread instead of white bread. Food recall: B: bowl of cereal (frosted wheat as well as another cereal with pecans and freeze dried strawberries, 2% milk) or bacon/sausage and eggs (tears up whole wheat bread to eat it). Sometimes won't  eat anything in the morning. S: sometimes pack of nabs D: tonight he will have hot dogs with cheese (1x/week), homemade tacos, sandwich (whole wheat bread), [employer]. He goes out to eat 2x/week. Drinks: diet soda (1-3) or flavored water, water. He reports his BG has been running higher than normal. he reports his breathing has improved overall since being in the hospital. Discussed general heart healthy eating and reviewed T2DM MNT as well as pulmnary MNT. Encouraged to start with adding in  additional non-starchy vegetables and to add in a snack mid-day with protein as he is not eating lunch most days; boiled egg, greek yogurt, peanut butter - he reports enjoying peanut butter and banana sandwiches so he may have that as well. So that he doesn't need to cook additional food, suggested he could use frozen or canned fruits/vegetables; discussed sodium and sugar considerations with canned foods.      Intervention Plan   Intervention Prescribe, educate and counsel regarding individualized specific dietary modifications aiming towards targeted core components such as weight, hypertension, lipid management, diabetes, heart failure and other comorbidities.;Nutrition handout(s) given to patient.    Expected Outcomes Short Term Goal: Understand basic principles of dietary content, such as calories, fat, sodium, cholesterol and nutrients.;Short Term Goal: A plan has been developed with personal nutrition goals set during dietitian appointment.;Long Term Goal: Adherence to prescribed nutrition plan.             Nutrition Assessments:  MEDIFICTS Score Key: ?70 Need to make dietary changes  40-70 Heart Healthy Diet ? 40 Therapeutic Level Cholesterol Diet  Flowsheet Row Cardiac Rehab from 05/04/2022 in [facility] Cardiac and Pulmonary Rehab  Picture Your Plate Total Score on Admission 53      Picture Your Plate Scores: <50 Unhealthy dietary pattern with much room for improvement. 41-50 Dietary pattern unlikely to meet recommendations for good health and room for improvement. 51-60 More healthful dietary pattern, with some room for improvement.  >60 Healthy dietary pattern, although there may be some specific behaviors that could be improved.    Nutrition Goals Re-Evaluation:  Nutrition Goals Re-Evaluation     [place] Name 06/09/22 1554 07/07/22 1544           Goals   Current Weight -- 184 lb (83.5 kg)      Nutrition Goal ST: add in protein rich snack, add non-starchy vegetables and  fruit to meals LT: increase fruit and vegetable intake to at least 5 per day, limit sodium <2g/day, meet energy and protein needs Work on portion control.      Comment -- Dustin Lara states he has been doing well with his diet. He feels like he does not need help with his current diet.      Expected Outcome Short: Continue to add in protein rich snack. Long: Continue to practice heart healthy eating patterns. Short: Attend HeartTrack stress management education to decrease stress. Long: Maintain exercise Post HeartTrack to keep stress at a minimum.               Nutrition Goals Discharge (Final Nutrition Goals Re-Evaluation):  Nutrition Goals Re-Evaluation - 07/07/22 1544       Goals   Current Weight 184 lb (83.5 kg)    Nutrition Goal Work on portion control.    Comment Dustin Lara states he has been doing well with his diet. He feels like he does not need help with his current diet.    Expected Outcome Short: Attend HeartTrack stress management education to decrease stress.  Long: Maintain exercise Post HeartTrack to keep stress at a minimum.             Psychosocial: Target Goals: Acknowledge presence or absence of significant depression and/or stress, maximize coping skills, provide positive support system. Participant is able to verbalize types and ability to use techniques and skills needed for reducing stress and depression.   Education: Stress, Anxiety, and Depression - Group verbal and visual presentation to define topics covered.  Reviews how body is impacted by stress, anxiety, and depression.  Also discusses healthy ways to reduce stress and to treat/manage anxiety and depression.  Written material given at graduation. Flowsheet Row Cardiac Rehab from 06/22/2022 in [facility] Cardiac and Pulmonary Rehab  Education need identified 05/04/22       Education: Sleep Hygiene -Provides group verbal and written instruction about how sleep can affect your health.  Define sleep hygiene, discuss  sleep cycles and impact of sleep habits. Review good sleep hygiene tips.    Initial Review & Psychosocial Screening:  Initial Psych Review & Screening - 04/25/22 1036       Initial Review   Current issues with None Identified      Family Dynamics   Good Support System? Yes    Comments Dustin Lara daughter and step son are good support systems for him. He other Arletha Pili looks after him as well. He lives by himself, his wife passed away four years ago.      Barriers   Psychosocial barriers to participate in program There are no identifiable barriers or psychosocial needs.;The patient should benefit from training in stress management and relaxation.      Screening Interventions   Interventions Encouraged to exercise;To provide support and resources with identified psychosocial needs;Provide feedback about the scores to participant    Expected Outcomes Short Term goal: Utilizing psychosocial counselor, staff and physician to assist with identification of specific Stressors or current issues interfering with healing process. Setting desired goal for each stressor or current issue identified.;Long Term Goal: Stressors or current issues are controlled or eliminated.;Short Term goal: Identification and review with participant of any Quality of Life or Depression concerns found by scoring the questionnaire.;Long Term goal: The participant improves quality of Life and PHQ9 Scores as seen by post scores and/or verbalization of changes             Quality of Life Scores:   Quality of Life - 05/04/22 1459       Quality of Life   Select Quality of Life      Quality of Life Scores   Health/Function Pre 24.36 %    Socioeconomic Pre 25.86 %    Psych/Spiritual Pre 29.14 %    Family Pre 22.88 %    GLOBAL Pre 25.55 %            Scores of 19 and below usually indicate a poorer quality of life in these areas.  A difference of  2-3 points is a clinically meaningful difference.  A difference of  2-3 points in the total score of the Quality of Life Index has been associated with significant improvement in overall quality of life, self-image, physical symptoms, and general health in studies assessing change in quality of life.  PHQ-9: Review Flowsheet       06/06/2022 06/01/2022 05/04/2022  Depression screen PHQ 2/9  Decreased Interest 0 0 0  Down, Depressed, Hopeless 0 0 1  PHQ - 2 Score 0 0 1  Altered sleeping 0 0 0  Tired, decreased energy _0 Change in appetite 0 0 0  Feeling bad or failure about yourself  0 0 1  Trouble concentrating 0 0 0  Moving slowly or fidgety/restless 0 0 2  Suicidal thoughts 0 0 0  PHQ-9 Score _1 Difficult doing work/chores Not difficult at all Not difficult at all Not difficult at all   Interpretation of Total Score  Total Score Depression Severity:  1-4 = Minimal depression, 5-9 = Mild depression, 10-14 = Moderate depression, 15-19 = Moderately severe depression, 20-27 = Severe depression   Psychosocial Evaluation and Intervention:  Psychosocial Evaluation - 04/25/22 1038       Psychosocial Evaluation & Interventions   Interventions Encouraged to exercise with the program and follow exercise prescription;Stress management education;Relaxation education    Comments Dustin Lara daughter and step son are good support systems for him. He other Arletha Pili looks after him as well. He lives by himself, his wife passed away four years ago.    Expected Outcomes Short: Start HeartTrack to help with mood. Long: Maintain a healthy mental state    Continue Psychosocial Services  Follow up required by staff             Psychosocial Re-Evaluation:  Psychosocial Re-Evaluation     [place] Name 06/09/22 1547 07/07/22 1543           Psychosocial Re-Evaluation   Current issues with None Identified None Identified      Comments Dustin Lara denies any major stressors at this time. He reports having a good support system made up by his children. He states that  he also can turn to members of his church for support. He also states that he is sleeping well at this time. Dustin Lara also reports that puzzles and exercise are good avenues for stress relief as well. Patient reports no issues with their current mental states, sleep, stress, depression or anxiety. Will follow up with patient in a few weeks for any changes.      Expected Outcomes Short: Continue to attend rehab for stress relief. Long: Maintain positive outlook Short: Continue to exercise regularly to support mental health and notify staff of any changes. Long: maintain mental health and well being through teaching of rehab or prescribed medications independently.      Interventions -- Encouraged to attend Cardiac Rehabilitation for the exercise      Continue Psychosocial Services  Follow up required by staff Follow up required by staff               Psychosocial Discharge (Final Psychosocial Re-Evaluation):  Psychosocial Re-Evaluation - 07/07/22 1543       Psychosocial Re-Evaluation   Current issues with None Identified    Comments Patient reports no issues with their current mental states, sleep, stress, depression or anxiety. Will follow up with patient in a few weeks for any changes.    Expected Outcomes Short: Continue to exercise regularly to support mental health and notify staff of any changes. Long: maintain mental health and well being through teaching of rehab or prescribed medications independently.    Interventions Encouraged to attend Cardiac Rehabilitation for the exercise    Continue Psychosocial Services  Follow up required by staff             Vocational Rehabilitation: Provide vocational rehab assistance to qualifying candidates.   Vocational Rehab Evaluation & Intervention:   Education: Education Goals: Education classes will be provided on a variety of topics geared toward  better understanding of heart health and risk factor modification. Participant will state  understanding/return demonstration of topics presented as noted by education test scores.  Learning Barriers/Preferences:  Learning Barriers/Preferences - 04/25/22 1032       Learning Barriers/Preferences   Learning Barriers None    Learning Preferences None             General Cardiac Education Topics:  AED/CPR: - Group verbal and written instruction with the use of models to demonstrate the basic use of the AED with the basic ABC's of resuscitation.   Anatomy and Cardiac Procedures: - Group verbal and visual presentation and models provide information about basic cardiac anatomy and function. Reviews the testing methods done to diagnose heart disease and the outcomes of the test results. Describes the treatment choices: Medical Management, Angioplasty, or Coronary Bypass Surgery for treating various heart conditions including Myocardial Infarction, Angina, Valve Disease, and Cardiac Arrhythmias.  Written material given at graduation. Flowsheet Row Cardiac Rehab from 06/22/2022 in [facility] Cardiac and Pulmonary Rehab  Education need identified 05/04/22       Medication Safety: - Group verbal and visual instruction to review commonly prescribed medications for heart and lung disease. Reviews the medication, class of the drug, and side effects. Includes the steps to properly store meds and maintain the prescription regimen.  Written material given at graduation.   Intimacy: - Group verbal instruction through game format to discuss how heart and lung disease can affect sexual intimacy. Written material given at graduation..   Know Your Numbers and Heart Failure: - Group verbal and visual instruction to discuss disease risk factors for cardiac and pulmonary disease and treatment options.  Reviews associated critical values for Overweight/Obesity, Hypertension, Cholesterol, and Diabetes.  Discusses basics of heart failure: signs/symptoms and treatments.  Introduces Heart Failure Zone  chart for action plan for heart failure.  Written material given at graduation. Flowsheet Row Cardiac Rehab from 06/22/2022 in [facility] Cardiac and Pulmonary Rehab  Date 05/11/22  Educator SB  Instruction Review Code 5- Refused Teaching       Infection Prevention: - Provides verbal and written material to individual with discussion of infection control including proper hand washing and proper equipment cleaning during exercise session. Flowsheet Row Cardiac Rehab from 06/22/2022 in [facility] Cardiac and Pulmonary Rehab  Date 04/25/22  Educator [facility]  Instruction Review Code 1- Verbalizes Understanding       Falls Prevention: - Provides verbal and written material to individual with discussion of falls prevention and safety. Flowsheet Row Cardiac Rehab from 06/22/2022 in [facility] Cardiac and Pulmonary Rehab  Date 04/25/22  Educator [facility]  Instruction Review Code 1- Verbalizes Understanding       Other: -Provides group and verbal instruction on various topics (see comments)   Knowledge Questionnaire Score:  Knowledge Questionnaire Score - 05/04/22 1501       Knowledge Questionnaire Score   Pre Score 18/26             Core Components/Risk Factors/Patient Goals at Admission:  Personal Goals and Risk Factors at Admission - 05/04/22 1501       Core Components/Risk Factors/Patient Goals on Admission    Weight Management Yes;Weight Maintenance    Intervention Weight Management: Develop a combined nutrition and exercise program designed to reach desired caloric intake, while maintaining appropriate intake of nutrient and fiber, sodium and fats, and appropriate energy expenditure required for the weight goal.;Weight Management: Provide education and appropriate resources to help participant work on and attain dietary goals.;Weight  Management/Obesity: Establish reasonable short term and long term weight goals.    Admit Weight 184 lb 6.4 oz (83.6 kg)    Goal Weight: Short Term 180 lb (81.6 kg)     Goal Weight: Long Term 175 lb (79.4 kg)    Expected Outcomes Short Term: Continue to assess and modify interventions until short term weight is achieved;Long Term: Adherence to nutrition and physical activity/exercise program aimed toward attainment of established weight goal;Weight Maintenance: Understanding of the daily nutrition guidelines, which includes 25-35% calories from fat, 7% or less cal from saturated fats, less than 233m cholesterol, less than 1.5gm of sodium, & 5 or more servings of fruits and vegetables daily;Understanding recommendations for meals to include 15-35% energy as protein, 25-35% energy from fat, 35-60% energy from carbohydrates, less than 2081mof dietary cholesterol, 20-35 gm of total fiber daily;Understanding of distribution of calorie intake throughout the day with the consumption of 4-5 meals/snacks    Diabetes Yes    Intervention Provide education about signs/symptoms and action to take for hypo/hyperglycemia.;Provide education about proper nutrition, including hydration, and aerobic/resistive exercise prescription along with prescribed medications to achieve blood glucose in normal ranges: Fasting glucose 65-99 mg/dL    Expected Outcomes Short Term: Participant verbalizes understanding of the signs/symptoms and immediate care of hyper/hypoglycemia, proper foot care and importance of medication, aerobic/resistive exercise and nutrition plan for blood glucose control.;Long Term: Attainment of HbA1C < 7%.    Hypertension Yes    Intervention Provide education on lifestyle modifcations including regular physical activity/exercise, weight management, moderate sodium restriction and increased consumption of fresh fruit, vegetables, and low fat dairy, alcohol moderation, and smoking cessation.;Monitor prescription use compliance.    Expected Outcomes Short Term: Continued assessment and intervention until BP is < 140/9048mG in hypertensive participants. < 130/21m44m in  hypertensive participants with diabetes, heart failure or chronic kidney disease.;Long Term: Maintenance of blood pressure at goal levels.    Lipids Yes    Intervention Provide education and support for participant on nutrition & aerobic/resistive exercise along with prescribed medications to achieve LDL <70mg46mL >40mg.77mExpected Outcomes Short Term: Participant states understanding of desired cholesterol values and is compliant with medications prescribed. Participant is following exercise prescription and nutrition guidelines.;Long Term: Cholesterol controlled with medications as prescribed, with individualized exercise RX and with personalized nutrition plan. Value goals: LDL < 70mg, 33m> 40 mg.             Education:Diabetes - Individual verbal and written instruction to review signs/symptoms of diabetes, desired ranges of glucose level fasting, after meals and with exercise. Acknowledge that pre and post exercise glucose checks will be done for 3 sessions at entry of program. [place]  Date 04/25/22  Educator J[facility] Review Code 1- Verbalizes Understanding       Core Components/Risk Factors/Patient Goals Review:   Goals and Risk Factor Review     Row Name 06/09/22 1558 07/07/22 1545           Core Components/Risk Factors/Patient Goals Review   Personal Goals Review Weight Management/Obesity;Diabetes;Hypertension Weight Management/Obesity      Review Dustin Lara iGraingerng well with his weight, but feels he could benefit from losing a little bit. He reports that he has been checking his blood sugar levels at home and they are staying within normal ranges. He has not been checking his BP at home because he does not have a  BP cuff. He is going to look into getting a cuff for his home. Dustin Lara wants to reach a weight goal of 175lbs. He wants his blood sugars to be even better with weight loss. This morning his  sugar was 173. He is going to try to lose 5 pounds in the next couple months.      Expected Outcomes Short: Start to monitor BP at home. Long: Continue to manage lifestyle risk factors. Short: lose couple a pounds in the next two weeks. Long: reach weight goal of 175lbs.               Core Components/Risk Factors/Patient Goals at Discharge (Final Review):   Goals and Risk Factor Review - 07/07/22 1545       Core Components/Risk Factors/Patient Goals Review   Personal Goals Review Weight Management/Obesity    Review Dustin Lara wants to reach a weight goal of 175lbs. He wants his blood sugars to be even better with weight loss. This morning his sugar was 173. He is going to try to lose 5 pounds in the next couple months.    Expected Outcomes Short: lose couple a pounds in the next two weeks. Long: reach weight goal of 175lbs.             ITP Comments:  ITP Comments     Row Name 04/25/22 1035 05/04/22 1459 05/05/22 1607 06/01/22 0959 06/29/22 0839   ITP Comments Virtual Visit completed. Patient informed on EP and RD appointment and 6 Minute walk test. Patient also informed of patient health questionnaires on My Chart. Patient Verbalizes understanding. Visit diagnosis can [facility] found in [facility] 02/14/[facility]. Completed 6MWT and gym orientation. Initial ITP created and sent for review to Dr. Emily Filbert, Medical Director. First full day of exercise!  Patient was oriented to gym and equipment including functions, settings, policies, and procedures.  Patient's individual exercise prescription and treatment plan were reviewed.  All starting workloads were established based on the results of the 6 minute walk test done at initial orientation visit.  The plan for exercise progression was also introduced and progression will be customized based on patient's performance and goals. 30 Day review completed. Medical Director ITP review done, changes made as directed, and signed approval by Medical Director.   NEW TO  PROGRAM 30 Day review completed. Medical Director ITP review done, changes made as directed, and signed approval by Medical Director.    [place] Name 07/27/22 1103           ITP Comments 30 Day review completed. Medical Director ITP review done, changes made as directed, and signed approval by Medical Director.                Comments:

## 2022-07-28 ENCOUNTER — Encounter: Payer: No Typology Code available for payment source | Admitting: *Deleted

## 2022-07-28 DIAGNOSIS — Z952 Presence of prosthetic heart valve: Secondary | ICD-10-CM | POA: Diagnosis not present

## 2022-07-28 NOTE — Progress Notes (Signed)
Daily Session Note  Patient Details  Name: Dustin Lara MRN: [id number] Date of Birth: Feb 11, 1941 Referring Provider:   Flowsheet Row Cardiac Rehab from 05/04/2022 in [facility] Cardiac and Pulmonary Rehab  Referring Provider Thakkar       Encounter Date: 07/28/2022  Check In:  Session Check In - 07/28/22 1534       Check-In   Supervising physician immediately available to respond to emergencies See telemetry face sheet for immediately available ER MD    Location [facility]-Cardiac & Pulmonary Rehab    Staff Present Renita Papa, RN BSN;Joseph Tessie Fass, RCP,RRT,BSRT;Megan Tamala Julian, RN, [place]    Virtual Visit No    Medication changes reported     No    Fall or balance concerns reported    No    Warm-up and Cool-down Performed on first and last piece of equipment    Resistance Training Performed Yes    VAD Patient? No    PAD/SET Patient? No      Pain Assessment   Currently in Pain? No/denies                Social History   Tobacco Use  Smoking Status Former   Packs/day: 0.25   Years: 20.00   Total pack years: 5.00   Types: Cigarettes   Quit date: 04/01/1974   Years since quitting: 48.3  Smokeless Tobacco Never    Goals Met:  Independence with exercise equipment Exercise tolerated well No report of concerns or symptoms today Strength training completed today  Goals Unmet:  Not Applicable  Comments: Pt able to follow exercise prescription today without complaint.  Will continue to monitor for progression.    Dr. Emily Filbert is Medical Director for [place].  Dr. Ottie Glazier is Medical Director for [facility] Pulmonary Rehabilitation.

## 2022-08-03 ENCOUNTER — Encounter: Payer: No Typology Code available for payment source | Attending: Physician Assistant | Admitting: *Deleted

## 2022-08-03 DIAGNOSIS — Z952 Presence of prosthetic heart valve: Secondary | ICD-10-CM | POA: Insufficient documentation

## 2022-08-03 NOTE — Progress Notes (Signed)
Daily Session Note  Patient Details  Name: Dustin Lara MRN: [id number] Date of Birth: 12-Jan-1941 Referring Provider:   Flowsheet Row Cardiac Rehab from 05/04/2022 in [facility] Cardiac and Pulmonary Rehab  Referring Provider Thakkar       Encounter Date: 08/03/2022  Check In:  Session Check In - 08/03/22 1541       Check-In   Supervising physician immediately available to respond to emergencies See telemetry face sheet for immediately available ER MD    Staff Present Renita Papa, RN BSN;Joseph Tessie Fass, Ernestina Patches, RN, [place]    Virtual Visit No    Medication changes reported     No    Fall or balance concerns reported    No    Warm-up and Cool-down Performed on first and last piece of equipment    Resistance Training Performed Yes    VAD Patient? No    PAD/SET Patient? No      Pain Assessment   Currently in Pain? No/denies                Social History   Tobacco Use  Smoking Status Former   Packs/day: 0.25   Years: 20.00   Total pack years: 5.00   Types: Cigarettes   Quit date: 04/01/1974   Years since quitting: 48.3  Smokeless Tobacco Never    Goals Met:  Independence with exercise equipment Exercise tolerated well No report of concerns or symptoms today Strength training completed today  Goals Unmet:  Not Applicable  Comments: Pt able to follow exercise prescription today without complaint.  Will continue to monitor for progression.    Dr. Emily Filbert is Medical Director for [place].  Dr. Ottie Glazier is Medical Director for [facility] Pulmonary Rehabilitation.

## 2022-08-04 ENCOUNTER — Encounter: Payer: No Typology Code available for payment source | Admitting: *Deleted

## 2022-08-04 DIAGNOSIS — Z952 Presence of prosthetic heart valve: Secondary | ICD-10-CM | POA: Diagnosis not present

## 2022-08-04 NOTE — Progress Notes (Signed)
Daily Session Note  Patient Details  Name: Dustin Lara MRN: [id number] Date of Birth: [birth year] Referring Provider:   Flowsheet Row Cardiac Rehab from 05/04/2022 in [facility] Cardiac and Pulmonary Rehab  Referring Provider Thakkar       Encounter Date: 08/04/2022  Check In:  Session Check In - 08/04/22 1542       Check-In   Supervising physician immediately available to respond to emergencies See telemetry face sheet for immediately available ER MD    Location [facility]-Cardiac & Pulmonary Rehab    Staff Present Renita Papa, RN BSN;Joseph Tessie Fass, RCP,RRT,BSRT;Noah [place], [place], Exercise Physiologist    Virtual Visit No    Medication changes reported     No    Fall or balance concerns reported    No    Warm-up and Cool-down Performed on first and last piece of equipment    Resistance Training Performed Yes    VAD Patient? No    PAD/SET Patient? No      Pain Assessment   Currently in Pain? No/denies                Social History   Tobacco Use  Smoking Status Former   Packs/day: 0.25   Years: 20.00   Total pack years: 5.00   Types: Cigarettes   Quit date: 04/01/1974   Years since quitting: 48.3  Smokeless Tobacco Never    Goals Met:  Independence with exercise equipment Exercise tolerated well No report of concerns or symptoms today Strength training completed today  Goals Unmet:  Not Applicable  Comments: Pt able to follow exercise prescription today without complaint.  Will continue to monitor for progression.    Dr. Emily Filbert is Medical Director for [place].  Dr. Ottie Glazier is Medical Director for [facility] Pulmonary Rehabilitation.

## 2022-08-08 ENCOUNTER — Encounter: Payer: No Typology Code available for payment source | Admitting: *Deleted

## 2022-08-08 DIAGNOSIS — Z952 Presence of prosthetic heart valve: Secondary | ICD-10-CM

## 2022-08-08 NOTE — Progress Notes (Signed)
Daily Session Note  Patient Details  Name: JAHMANI STAUP MRN: [id number] Date of Birth: 07/19/1941 Referring Provider:   Flowsheet Row Cardiac Rehab from 05/04/2022 in [facility] Cardiac and Pulmonary Rehab  Referring Provider Thakkar       Encounter Date: 08/08/2022  Check In:  Session Check In - 08/08/22 1538       Check-In   Supervising physician immediately available to respond to emergencies See telemetry face sheet for immediately available ER MD    Location [facility]-Cardiac & Pulmonary Rehab    Staff Present Renita Papa, RN BSN;Noah [place]ick[place], BS, Exercise Physiologist;Joseph Tessie Fass, [place]    Virtual Visit No    Medication changes reported     No    Fall or balance concerns reported    No    Warm-up and Cool-down Performed on first and last piece of equipment    Resistance Training Performed Yes    VAD Patient? No    PAD/SET Patient? No      Pain Assessment   Currently in Pain? No/denies                Social History   Tobacco Use  Smoking Status Former   Packs/day: 0.25   Years: 20.00   Total pack years: 5.00   Types: Cigarettes   Quit date: 04/01/1974   Years since quitting: 48.3  Smokeless Tobacco Never    Goals Met:  Proper associated with RPD/PD & O2 Sat Independence with exercise equipment Exercise tolerated well No report of concerns or symptoms today Strength training completed today  Goals Unmet:  Not Applicable  Comments: Pt able to follow exercise prescription today without complaint.  Will continue to monitor for progression.    Dr. Emily Filbert is Medical Director for [place].  Dr. Ottie Glazier is Medical Director for [facility] Pulmonary Rehabilitation.

## 2022-08-10 ENCOUNTER — Encounter: Payer: No Typology Code available for payment source | Admitting: *Deleted

## 2022-08-10 DIAGNOSIS — Z952 Presence of prosthetic heart valve: Secondary | ICD-10-CM | POA: Diagnosis not present

## 2022-08-10 NOTE — Progress Notes (Signed)
Daily Session Note  Patient Details  Name: Dustin Lara MRN: [id number] Date of Birth: 1941/07/20 Referring Provider:   Flowsheet Row Cardiac Rehab from 05/04/2022 in [facility] Cardiac and Pulmonary Rehab  Referring Provider Thakkar       Encounter Date: 08/10/2022  Check In:  Session Check In - 08/10/22 1618       Check-In   Supervising physician immediately available to respond to emergencies See telemetry face sheet for immediately available ER MD    Location [facility]-Cardiac & Pulmonary Rehab    Staff Present Renita Papa, RN BSN;Joseph Tessie Fass, RCP,RRT,BSRT;Megan Tamala Julian, RN, [place]    Virtual Visit No    Medication changes reported     No    Fall or balance concerns reported    No    Warm-up and Cool-down Performed on first and last piece of equipment    Resistance Training Performed Yes    VAD Patient? No    PAD/SET Patient? No      Pain Assessment   Currently in Pain? No/denies                Social History   Tobacco Use  Smoking Status Former   Packs/day: 0.25   Years: 20.00   Total pack years: 5.00   Types: Cigarettes   Quit date: 04/01/1974   Years since quitting: 48.3  Smokeless Tobacco Never    Goals Met:  Independence with exercise equipment Exercise tolerated well No report of concerns or symptoms today Strength training completed today  Goals Unmet:  Not Applicable  Comments:  [place] graduated today from  rehab with 36 sessions completed.  Details of the patient's exercise prescription and what He needs to do in order to continue the prescription and progress were discussed with patient.  Patient was given a copy of prescription and goals.  Patient verbalized understanding.  [place] plans to continue to exercise by walking and using resistance bands .    Dr. Emily Filbert is Medical Director for [place].  Dr. Ottie Glazier is Medical Director for [facility] Pulmonary Rehabilitation.

## 2022-08-10 NOTE — Progress Notes (Signed)
Cardiac Individual Treatment Plan  Patient Details  Name: Dustin Lara MRN: [id number] Date of Birth: 20-Apr-1941 Referring Provider:   Flowsheet Row Cardiac Rehab from 05/04/2022 in [facility] Cardiac and Pulmonary Rehab  Referring Provider Thakkar       Initial Encounter Date:  Flowsheet Row Cardiac Rehab from 05/04/2022 in [facility] Cardiac and Pulmonary Rehab  Date 05/04/22       Visit Diagnosis: S/P TAVR (transcatheter aortic valve replacement)  Patient's Home Medications on Admission:  Current Outpatient Medications:    acetaminophen (TYLENOL) 325 MG tablet, TAKE TWO TABLETS BY MOUTH THREE TIMES A DAY AS NEEDED, Disp: , Rfl:    albuterol (VENTOLIN HFA) 108 (90 Base) MCG/ACT inhaler, INHALE 2 PUFFS BY ORAL INHALATION 3 TIMES A DAY AS NEEDED FOR BREATHING. BE SURE TO [place] MOUTHPIECE WITH WARM WATER ONCE A WEEK, Disp: , Rfl:    Alogliptin Benzoate 6.25 MG TABS, TAKE ONE TABLET BY MOUTH EVERY MORNING FOR DIABETES, Disp: , Rfl:    amLODipine (NORVASC) 5 MG tablet, TAKE ONE-HALF TABLET BY MOUTH EVERY DAY ESSENTIAL HYPERTENSION FOR BLOOD PRESSURE, Disp: , Rfl:    ascorbic acid (VITAMIN C) 100 MG tablet, TAKE  BY MOUTH, Disp: , Rfl:    atorvastatin (LIPITOR) 20 MG tablet, TAKE ONE TABLET (20MG) BY MOUTH AT BEDTIME, Disp: , Rfl:    Calcium Carb-Cholecalciferol 500-10 MG-MCG TABS, Take 1 tablet by mouth daily., Disp: , Rfl:    Calcium Carbonate-Vitamin D 250-3.125 MG-MCG TABS, TAKE  BY MOUTH, Disp: , Rfl:    chlorthalidone (HYGROTON) 25 MG tablet, TAKE ONE-HALF TABLET BY MOUTH EVERY DAY FOR BLOOD PRESSURE *IMPORTANT STOP TAKING  HYDROCHLOROTHIZAIDE, Disp: , Rfl:    ferrous gluconate (FERGON) 324 MG tablet, Take 1 tablet by mouth every other day., Disp: , Rfl:    ferrous sulfate 325 (65 FE) MG EC tablet, Take by mouth., Disp: , Rfl:    finasteride (PROSCAR) 5 MG tablet, Take 1 tablet by mouth daily., Disp: , Rfl:    fluticasone-salmeterol (ADVAIR) 100-50 MCG/ACT AEPB, INHALE 1 PUFF BY MOUTH EVERY  12 HOURS, Disp: , Rfl:    glipiZIDE (GLUCOTROL) 10 MG tablet, TAKE ONE TABLET BY MOUTH TWO TIMES A DAY . DOSE INCREASE, Disp: , Rfl:    glucose blood (PRECISION QID TEST) test strip, Use 1 strip twice a week, Disp: , Rfl:    ketoconazole (NIZORAL) 2 % shampoo, SHAMPOO AS DIRECTED TOPICALLY TUESDAY,THURSDAY,SATURDAY AS NEEDED, Disp: , Rfl:    losartan (COZAAR) 50 MG tablet, TAKE ONE TABLET BY MOUTH EVERY DAY ESSENTIAL HYPERTENSION, Disp: , Rfl:    polyethylene glycol (MIRALAX / GLYCOLAX) 17 g packet, Take by mouth., Disp: , Rfl:    senna-docusate (SENOKOT-S) 8.6-50 MG tablet, Take by mouth., Disp: , Rfl:    tamsulosin (FLOMAX) 0.4 MG CAPS capsule, Take 1 capsule by mouth every evening., Disp: , Rfl:    Tiotropium Bromide Monohydrate 2.5 MCG/ACT AERS, INHALE 2 INHALATIONS BY ORAL INHALATION EVERY DAY, Disp: , Rfl:    traZODone (DESYREL) 50 MG tablet, TAKE ONE AND ONE-HALF TABLETS BY MOUTH AT BEDTIME FOR SLEEP AND MOOD, Disp: , Rfl:   Past Medical History: Past Medical History:  Diagnosis Date   Hypertension     Tobacco Use: Social History   Tobacco Use  Smoking Status Former   Packs/day: 0.25   Years: 20.00   Total pack years: 5.00   Types: Cigarettes   Quit date: 04/01/1974   Years since quitting: 48.3  Smokeless Tobacco Never  Labs: Review Flowsheet        No data to display           Exercise Target Goals: Exercise Program Goal: Individual exercise prescription set using results from initial 6 min walk test and THRR while considering  patient's activity barriers and safety.   Exercise Prescription Goal: Initial exercise prescription builds to 30-45 minutes a day of aerobic activity, 2-3 days per week.  Home exercise guidelines will be given to patient during program as part of exercise prescription that the participant will acknowledge.   Education: Aerobic Exercise: - Group verbal and visual presentation on the components of exercise prescription. Introduces  F.I.T.T principle from ACSM for exercise prescriptions.  Reviews F.I.T.T. principles of aerobic exercise including progression. Written material given at graduation. Flowsheet Row Cardiac Rehab from 08/03/2022 in [facility] Cardiac and Pulmonary Rehab  Education need identified 05/04/22       Education: Resistance Exercise: - Group verbal and visual presentation on the components of exercise prescription. Introduces F.I.T.T principle from ACSM for exercise prescriptions  Reviews F.I.T.T. principles of resistance exercise including progression. Written material given at graduation. Flowsheet Row Cardiac Rehab from 08/03/2022 in [facility] Cardiac and Pulmonary Rehab  Date 06/22/22  Educator KW  Instruction Review Code 1- [employer] Understanding        Education: Exercise & Equipment Safety: - Individual verbal instruction and demonstration of equipment use and safety with use of the equipment. Flowsheet Row Cardiac Rehab from 08/03/2022 in [facility] Cardiac and Pulmonary Rehab  Date 04/25/22  Educator [facility]  Instruction Review Code 1- Verbalizes Understanding       Education: Exercise Physiology & General Exercise Guidelines: - Group verbal and written instruction with models to review the exercise physiology of the cardiovascular system and associated critical values. Provides general exercise guidelines with specific guidelines to those with heart or lung disease.    Education: Flexibility, Balance, Mind/Body Relaxation: - Group verbal and visual presentation with interactive activity on the components of exercise prescription. Introduces F.I.T.T principle from ACSM for exercise prescriptions. Reviews F.I.T.T. principles of flexibility and balance exercise training including progression. Also discusses the mind body connection.  Reviews various relaxation techniques to help reduce and manage stress (i.e. Deep breathing, progressive muscle relaxation, and visualization). Balance handout provided to take home.  Written material given at graduation. Flowsheet Row Cardiac Rehab from 08/03/2022 in [facility] Cardiac and Pulmonary Rehab  Date 06/22/22  Educator KW  Instruction Review Code 1- Verbalizes Understanding       Activity Barriers & Risk Stratification:  Activity Barriers & Cardiac Risk Stratification - 05/04/22 1505       Activity Barriers & Cardiac Risk Stratification   Activity Barriers Muscular Weakness;Deconditioning;Shortness of Breath    Cardiac Risk Stratification High             6 Minute Walk:  6 Minute Walk     Row Name 05/04/22 1504 07/20/22 1554       6 Minute Walk   Phase Initial Discharge    Distance 880 feet 1395 feet    Distance % Change -- 58.5 %    Distance Feet Change -- 515 ft    Walk Time 6 minutes 6 minutes    # of Rest Breaks 0 0    MPH 1.67 2.64    METS 1.55 2.46    RPE 11 12    Perceived Dyspnea  1 0    VO2 Peak 5.43 8.61    Symptoms Yes (comment) No  Comments Chest Tightness, SOB --    Resting HR 64 bpm 62 bpm    Resting BP 128/56 120/62    Resting Oxygen Saturation  97 % 98 %    Exercise Oxygen Saturation  during 6 min walk 99 % 99 %    Max Ex. HR 103 bpm 110 bpm    Max Ex. BP 148/62 144/66    2 Minute Post BP 136/66 --             Oxygen Initial Assessment:   Oxygen Re-Evaluation:   Oxygen Discharge (Final Oxygen Re-Evaluation):   Initial Exercise Prescription:  Initial Exercise Prescription - 05/04/22 1500       Date of Initial Exercise RX and Referring Provider   Date 05/04/22    Referring Provider Thakkar      Oxygen   Maintain Oxygen Saturation 88% or higher      Treadmill   MPH 1.8    Grade 0.5    Minutes 15    METs 2.5      Recumbant Bike   Level 1    RPM 50    Watts 12    Minutes 15    METs 1.55      NuStep   Level 2    SPM 80    Minutes 15    METs 1.55      T5 Nustep   Level 1    SPM 80    Minutes 15    METs 1.55      Prescription Details   Frequency (times per week) 3    Duration  Progress to 30 minutes of continuous aerobic without signs/symptoms of physical distress      Intensity   THRR 40-80% of Max Heartrate 93-123    Ratings of Perceived Exertion 11-13    Perceived Dyspnea 0-4      Progression   Progression Continue to progress workloads to maintain intensity without signs/symptoms of physical distress.      Resistance Training   Training Prescription Yes    Weight 3 lb    Reps 10-15             Perform Capillary Blood Glucose checks as needed.  Exercise Prescription Changes:   Exercise Prescription Changes     Row Name 05/04/22 1500 05/16/22 1300 05/26/22 1500 05/31/22 1400 06/14/22 1500     Response to Exercise   Blood Pressure (Admit) 128/56 104/64 -- 134/66 134/64   Blood Pressure (Exercise) 148/62 124/64 -- 140/77 --   Blood Pressure (Exit) 136/66 120/60 -- 124/56 126/54   Heart Rate (Admit) 64 bpm 8 bpm -- 76 bpm 86 bpm   Heart Rate (Exercise) 103 bpm 133 bpm -- 147 bpm 138 bpm   Heart Rate (Exit) 59 bpm 95 bpm -- 111 bpm 94 bpm   Oxygen Saturation (Admit) 97 % -- -- -- --   Oxygen Saturation (Exercise) 99 % -- -- -- --   Rating of Perceived Exertion (Exercise) 11 15 -- 13 12   Perceived Dyspnea (Exercise) 1 -- -- -- --   Symptoms chest tightness, SOB none -- none none   Comments Results 3rd full day oof exercise -- -- --   Duration -- Progress to 30 minutes of  aerobic without signs/symptoms of physical distress -- Continue with 30 min of aerobic exercise without signs/symptoms of physical distress. Continue with 30 min of aerobic exercise without signs/symptoms of physical distress.   Intensity -- THRR unchanged -- THRR unchanged  THRR unchanged     Progression   Progression -- Continue to progress workloads to maintain intensity without signs/symptoms of physical distress. -- Continue to progress workloads to maintain intensity without signs/symptoms of physical distress. Continue to progress workloads to maintain intensity  without signs/symptoms of physical distress.   Average METs -- 2.46 -- 3.12 2.99     Resistance Training   Training Prescription -- Yes -- Yes Yes   Weight -- 3 lb -- 3 lb 3 lb   Reps -- 10-15 -- 10-15 10-15     Interval Training   Interval Training -- No -- No No     Treadmill   MPH -- 2 -- 2.5 2.5   Grade -- 0.5 -- 3 1.5   Minutes -- 15 -- 15 15   METs -- 2.67 -- 3.95 3.43     Recumbant Bike   Level -- 2 -- 3 --   Watts -- 25 -- 23 --   Minutes -- 15 -- 15 --   METs -- 2.9 -- -- --     NuStep   Level -- 3 -- 3 3   Minutes -- 15 -- 15 15   METs -- 2.4 -- 3 3     REL-XR   Level -- -- -- -- 1   Minutes -- -- -- -- 15   METs -- -- -- -- 2.2     Home Exercise Plan   Plans to continue exercise at -- -- Home (comment)  Walking and bands at home; considering joining [employer] Home (comment)  Walking and bands at home; considering joining [employer] Home (comment)  Walking and bands at home; considering joining [employer]   Frequency -- -- Add 2 additional days to program exercise sessions. Add 2 additional days to program exercise sessions. Add 2 additional days to program exercise sessions.   Initial Home Exercises Provided -- -- 05/26/22 05/26/22 05/26/22     Oxygen   Maintain Oxygen Saturation -- 88% or higher 88% or higher 88% or higher 88% or higher    Row Name 06/29/22 1400 07/13/22 1200 07/27/22 1500 08/10/22 1200       Response to Exercise   Blood Pressure (Admit) 132/74 122/60 118/54 122/62    Blood Pressure (Exit) 108/60 122/60 138/72 136/64    Heart Rate (Admit) 80 bpm 84 bpm 74 bpm 84 bpm    Heart Rate (Exercise) 125 bpm 118 bpm 111 bpm 116 bpm    Heart Rate (Exit) 88 bpm 77 bpm 92 bpm 106 bpm    Rating of Perceived Exertion (Exercise) 14 13 14 13     Symptoms none none none none    Duration Continue with 30 min of aerobic exercise without signs/symptoms of physical distress. Continue with 30 min of aerobic exercise without signs/symptoms of  physical distress. Continue with 30 min of aerobic exercise without signs/symptoms of physical distress. Continue with 30 min of aerobic exercise without signs/symptoms of physical distress.    Intensity THRR unchanged THRR unchanged THRR unchanged THRR unchanged      Progression   Progression Continue to progress workloads to maintain intensity without signs/symptoms of physical distress. Continue to progress workloads to maintain intensity without signs/symptoms of physical distress. Continue to progress workloads to maintain intensity without signs/symptoms of physical distress. Continue to progress workloads to maintain intensity without signs/symptoms of physical distress.    Average METs 2.99 2.91 3.09 3.19      Resistance Training   Training Prescription  Yes Yes Yes Yes    Weight 3 lb 3 lb 3 lb 3 lb    Reps 10-15 10-15 10-15 10-15      Interval Training   Interval Training No No No No      Treadmill   MPH 2.5 2 2.5 2.5    Grade 1.5 1 1 1     Minutes 15 15 15 15     METs 3.43 2.81 3.26 3.26      Recumbant Bike   Level -- 3 2.5 2.5    Watts -- 25 -- 25    Minutes -- 15 15 15     METs -- 2.8 3.5 3.6      NuStep   Level 4 4 4 4     Minutes 15 15 15 15     METs 3.2 3.1 3.3 3.4      Home Exercise Plan   Plans to continue exercise at Home (comment)  Walking and bands at home; considering joining [employer] Home (comment)  Walking and bands at home; considering joining [employer] (comment)  Walking and bands at home; considering joining [employer] (comment)  Walking and bands at home; considering joining [employer]    Frequency Add 2 additional days to program exercise sessions. Add 2 additional days to program exercise sessions. Add 2 additional days to program exercise sessions. Add 2 additional days to program exercise sessions.    Initial Home Exercises Provided 05/26/22 05/26/22 05/26/22 05/26/22      Oxygen   Maintain Oxygen Saturation 88% or higher 88%  or higher 88% or higher 88% or higher             Exercise Comments:   Exercise Comments     Row Name 05/05/22 1607 08/10/22 1620         Exercise Comments First full day of exercise!  Patient was oriented to gym and equipment including functions, settings, policies, and procedures.  Patient's individual exercise prescription and treatment plan were reviewed.  All starting workloads were established based on the results of the 6 minute walk test done at initial orientation visit.  The plan for exercise progression was also introduced and progression will be customized based on patient's performance and goals. Clark graduated today from  rehab with 36 sessions completed.  Details of the patient's exercise prescription and what He needs to do in order to continue the prescription and progress were discussed with patient.  Patient was given a copy of prescription and goals.  Patient verbalized understanding.  Bearl plans to continue to exercise by walking and using resistance bands .               Exercise Goals and Review:   Exercise Goals     Row Name 05/04/22 1517             Exercise Goals   Increase Physical Activity Yes       Intervention Provide advice, education, support and counseling about physical activity/exercise needs.;Develop an individualized exercise prescription for aerobic and resistive training based on initial evaluation findings, risk stratification, comorbidities and participant's personal goals.       Expected Outcomes Short Term: Attend rehab on a regular basis to increase amount of physical activity.;Long Term: Add in home exercise to make exercise part of routine and to increase amount of physical activity.;Long Term: Exercising regularly at least 3-5 days a week.       Increase Strength and Stamina Yes  Intervention Develop an individualized exercise prescription for aerobic and resistive training based on initial evaluation findings, risk  stratification, comorbidities and participant's personal goals.;Provide advice, education, support and counseling about physical activity/exercise needs.       Expected Outcomes Short Term: Increase workloads from initial exercise prescription for resistance, speed, and METs.;Short Term: Perform resistance training exercises routinely during rehab and add in resistance training at home;Long Term: Improve cardiorespiratory fitness, muscular endurance and strength as measured by increased METs and functional capacity ( )       Able to understand and use rate of perceived exertion (RPE) scale Yes       Intervention Provide education and explanation on how to use RPE scale       Expected Outcomes Short Term: Able to use RPE daily in rehab to express subjective intensity level;Long Term:  Able to use RPE to guide intensity level when exercising independently       Able to understand and use Dyspnea scale Yes       Intervention Provide education and explanation on how to use Dyspnea scale       Expected Outcomes Long Term: Able to use Dyspnea scale to guide intensity level when exercising independently;Short Term: Able to use Dyspnea scale daily in rehab to express subjective sense of shortness of breath during exertion       Knowledge and understanding of Target Heart Rate Range (THRR) Yes       Intervention Provide education and explanation of THRR including how the numbers were predicted and where they are located for reference       Expected Outcomes Short Term: Able to state/look up THRR;Long Term: Able to use THRR to govern intensity when exercising independently;Short Term: Able to use daily as guideline for intensity in rehab       Able to check pulse independently Yes       Intervention Provide education and demonstration on how to check pulse in carotid and radial arteries.;Review the importance of being able to check your own pulse for safety during independent exercise       Expected Outcomes  Short Term: Able to explain why pulse checking is important during independent exercise;Long Term: Able to check pulse independently and accurately       Understanding of Exercise Prescription Yes       Intervention Provide education, explanation, and written materials on patient's individual exercise prescription       Expected Outcomes Short Term: Able to explain program exercise prescription;Long Term: Able to explain home exercise prescription to exercise independently                Exercise Goals Re-Evaluation :  Exercise Goals Re-Evaluation     Row Name 05/05/22 1607 05/16/22 1310 05/26/22 1600 05/31/22 1413 06/09/22 1543     Exercise Goal Re-Evaluation   Exercise Goals Review Knowledge and understanding of Target Heart Rate Range (THRR);Able to understand and use rate of perceived exertion (RPE) scale;Understanding of Exercise Prescription;Able to understand and use Dyspnea scale Increase Physical Activity;Increase Strength and Stamina;Understanding of Exercise Prescription Increase Physical Activity;Increase Strength and Stamina;Understanding of Exercise Prescription Increase Physical Activity;Increase Strength and Stamina;Understanding of Exercise Prescription Increase Physical Activity;Increase Strength and Stamina;Understanding of Exercise Prescription   Comments Reviewed RPE and dyspnea scales, THR and program prescription with pt today.  Pt voiced understanding and was given a copy of goals to take home. Jefferson is off to a good start with rehab for the first couple of sessions he  has been here. He is able to do his initial exercise prescription and is already up to level 3 on the T4 Nustep. We will contiue to monitor as he progresses. Reviewed home exercise with pt today.  Pt plans to walk and use resistance bands at home for exercise. He is also considering joining [employer]. Reviewed THR, pulse, RPE, sign and symptoms, pulse oximetery and when to call 911 or MD.  Also discussed  weather considerations and indoor options.  Pt voiced understanding. Joziyah continues to do well in rehab. He recently improved his overall average MET level to 3.12 METs. He also increased his workload on the treadmill to 2.5 mph and an incline of 3%. He has tolerated 3 lb for hand weights as well. We will continue to monitor his progress in the program. Sheddrick is doing well in rehab. He has been progressing with his workloads well. He states that he is feeling much better since starting the program. Derion is also doing some walking at home a couple of times a week when it is warm outside. He also states that he purchased 5 lb hand weights for resistance training at home. We will continue to monitor his progress in the program.   Expected Outcomes Short: Use RPE daily to regulate intensity. Long: Follow program prescription in THR. Short: Continue curent exercise prescription and attend rehab Long: Improve overall strength and stamina Short: Begin to walk more on days away from rehab. Long: Continue to increase strength and stamina. Short: Continue to increase workloads as tolerated. Long: Continue to increase strength and stamina. Short: Continue to walk at home and begin using hand weights. Long: Continue to exercise independently.    Row Name 06/14/22 1613 06/29/22 1444 07/13/22 1205 07/27/22 1550 08/04/22 1538     Exercise Goal Re-Evaluation   Exercise Goals Review Increase Physical Activity;Increase Strength and Stamina;Understanding of Exercise Prescription Increase Physical Activity;Increase Strength and Stamina;Understanding of Exercise Prescription Increase Physical Activity;Increase Strength and Stamina;Understanding of Exercise Prescription Increase Physical Activity;Increase Strength and Stamina;Understanding of Exercise Prescription Increase Physical Activity;Increase Strength and Stamina;Understanding of Exercise Prescription   Comments Mishael continues to do well in rehab. He has been at a  consistent 2.5/1.5% workload on the treadmill, his HR has been getting a little high on the treadmill only, and will try to modify his workload to bring it down a bit- he is asymptomatic. He has been at level 1 on the XR and would benefit from increasing. Will continue to monitor. Ramona is doing well in rehab. He has consistently worked at a MET level above 3 METs. He has also improved to level 4 on the T4. He has tolerated the treadmill at a speed of 2.5 mph and an incline of 1.5% as well. We will continue to monitor his progress in the program. Jeffey continues to do well in rehab.  He has been consistently at level 4 on the T4 Nustep.  He dropped his workload a little bit on the treadmill and staff will encourage to increase it back to his previous workload, and progress from there. He continues to reach his THR. He is due for his post and we hope to see significant improvement. Will continue to monitor. Markies is doing well in rehab and is close to graduating. He completed his post and improved by 58.5%. He also was able to increase his speed on the treadmill back up to 2.5 mph. He increased his overall average MET level to 3.09 METs as  well. We will continue to monitor his progress. Torres will be discharging soon. He plans to go to the [place] [facility] and use the aerobic machines for exercise. He also wantes to walk at the park once it gets warmer outside. Eulice states that attending cardiac rehab has increased his energy and allowed him to do more.   Expected Outcomes Short: Increase to level 2 on XR Long: Continue to increase overall MET level Short: Continue to increase the speed on the treadmill. Long: Continue to increase strength and stamina. Short: Improve on post 6MWT Long: Continue to increase overall MET level Short: Graduate. Long: Continue to exercise independently. Short: Graduate. Long: Continue to exercise independently.    [place] Name 08/10/22 1234             Exercise Goal  Re-Evaluation   Exercise Goals Review Increase Physical Activity;Increase Strength and Stamina;Understanding of Exercise Prescription       Comments Deundra continues to do well in rehab and only has a few sessions left until he graduates.  He continues to reach his THR and maintain appropriate RPEs and oxygen saturations. We will continue to monitor until he graduates.       Expected Outcomes Short: Graduate Long: Continue independent exercise at home                Discharge Exercise Prescription (Final Exercise Prescription Changes):  Exercise Prescription Changes - 08/10/22 1200       Response to Exercise   Blood Pressure (Admit) 122/62    Blood Pressure (Exit) 136/64    Heart Rate (Admit) 84 bpm    Heart Rate (Exercise) 116 bpm    Heart Rate (Exit) 106 bpm    Rating of Perceived Exertion (Exercise) 13    Symptoms none    Duration Continue with 30 min of aerobic exercise without signs/symptoms of physical distress.    Intensity THRR unchanged      Progression   Progression Continue to progress workloads to maintain intensity without signs/symptoms of physical distress.    Average METs 3.19      Resistance Training   Training Prescription Yes    Weight 3 lb    Reps 10-15      Interval Training   Interval Training No      Treadmill   MPH 2.5    Grade 1    Minutes 15    METs 3.26      Recumbant Bike   Level 2.5    Watts 25    Minutes 15    METs 3.6      NuStep   Level 4    Minutes 15    METs 3.4      Home Exercise Plan   Plans to continue exercise at Home (comment)   Walking and bands at home; considering joining [employer]   Frequency Add 2 additional days to program exercise sessions.    Initial Home Exercises Provided 05/26/22      Oxygen   Maintain Oxygen Saturation 88% or higher             Nutrition:  Target Goals: Understanding of nutrition guidelines, daily intake of sodium 1500mg , cholesterol 200mg , calories 30% from fat and 7% or  less from saturated fats, daily to have 5 or more servings of fruits and vegetables.  Education: All About Nutrition: -Group instruction provided by verbal, written material, interactive activities, discussions, models, and posters to present general guidelines for heart healthy nutrition including fat, fiber,  MyPlate, the role of sodium in heart healthy nutrition, utilization of the nutrition label, and utilization of this knowledge for meal planning. Follow up email sent as well. Written material given at graduation. Flowsheet Row Cardiac Rehab from 08/03/2022 in [facility] Cardiac and Pulmonary Rehab  Education need identified 05/04/22       Biometrics:  Pre Biometrics - 05/04/22 1517       Pre Biometrics   Height 5' 6.4" (1.687 m)    Weight 184 lb 6.4 oz (83.6 kg)    Waist Circumference 41 inches    Hip Circumference 41.5 inches    Waist to Hip Ratio 0.99 %    BMI (Calculated) 29.39    Single Leg Stand 5.45 seconds   L            Post Biometrics - 07/20/22 1555        Post  Biometrics   Height 5' 6.4" (1.687 m)    Weight 188 lb (85.3 kg)    Waist Circumference 40.5 inches    Hip Circumference 39.5 inches    Waist to Hip Ratio 1.03 %    BMI (Calculated) 29.96    Single Leg Stand 7.96 seconds   L            Nutrition Therapy Plan and Nutrition Goals:  Nutrition Therapy & Goals - 05/04/22 1445       Nutrition Therapy   Diet Heart healthy, low Na, T2DM MNT    Drug/Food Interactions Statins/Certain Fruits    Protein (specify units) 90-95g    Fiber 25 grams    Whole Grain Foods 3 servings    Saturated Fats 14 max. grams    Fruits and Vegetables 8 servings/day    Sodium 2 grams      Personal Nutrition Goals   Nutrition Goal ST: add in protein rich snack, add non-starchy vegetables and fruit to meals LT: increase fruit and vegetable intake to at least 5 per day, limit sodium <2g/day, meet energy and protein needs    Comments He chooses whole wheat bread instead of  white bread. Food recall: B: bowl of cereal (frosted wheat as well as another cereal with pecans and freeze dried strawberries, 2% milk) or bacon/sausage and eggs (tears up whole wheat bread to eat it). Sometimes won't eat anything in the morning. S: sometimes pack of nabs D: tonight he will have hot dogs with cheese (1x/week), homemade tacos, sandwich (whole wheat bread), [employer]. He goes out to eat 2x/week. Drinks: diet soda (1-3) or flavored water, water. He reports his BG has been running higher than normal. he reports his breathing has improved overall since being in the hospital. Discussed general heart healthy eating and reviewed T2DM MNT as well as pulmnary MNT. Encouraged to start with adding in additional non-starchy vegetables and to add in a snack mid-day with protein as he is not eating lunch most days; boiled egg, greek yogurt, peanut butter - he reports enjoying peanut butter and banana sandwiches so he may have that as well. So that he doesn't need to cook additional food, suggested he could use frozen or canned fruits/vegetables; discussed sodium and sugar considerations with canned foods.      Intervention Plan   Intervention Prescribe, educate and counsel regarding individualized specific dietary modifications aiming towards targeted core components such as weight, hypertension, lipid management, diabetes, heart failure and other comorbidities.;Nutrition handout(s) given to patient.    Expected Outcomes Short Term Goal: Understand basic principles of dietary content,  such as calories, fat, sodium, cholesterol and nutrients.;Short Term Goal: A plan has been developed with personal nutrition goals set during dietitian appointment.;Long Term Goal: Adherence to prescribed nutrition plan.             Nutrition Assessments:  MEDIFICTS Score Key: ?70 Need to make dietary changes  40-70 Heart Healthy Diet ? 40 Therapeutic Level Cholesterol Diet  Flowsheet Row Cardiac Rehab from  05/04/2022 in [facility] Cardiac and Pulmonary Rehab  Picture Your Plate Total Score on Admission 53      Picture Your Plate Scores: <16 Unhealthy dietary pattern with much room for improvement. 41-50 Dietary pattern unlikely to meet recommendations for good health and room for improvement. 51-60 More healthful dietary pattern, with some room for improvement.  >60 Healthy dietary pattern, although there may be some specific behaviors that could be improved.    Nutrition Goals Re-Evaluation:  Nutrition Goals Re-Evaluation     Row Name 06/09/22 1554 07/07/22 1544 08/04/22 1542         Goals   Current Weight -- 184 lb (83.5 kg) --     Nutrition Goal ST: add in protein rich snack, add non-starchy vegetables and fruit to meals LT: increase fruit and vegetable intake to at least 5 per day, limit sodium <2g/day, meet energy and protein needs Work on portion control. Work on portion control.     Comment -- Rembert states he has been doing well with his diet. He feels like he does not need help with his current diet. [place] states he has been doing well with his diet. He feels like he does not need help with his current diet.     Expected Outcome Short: Continue to add in protein rich snack. Long: Continue to practice heart healthy eating patterns. Short: Attend HeartTrack stress management education to decrease stress. Long: Maintain exercise Post HeartTrack to keep stress at a minimum. Short: Continue to work on portion control. Long: Continue to practice heart healthy eating after completing HeartTrack.              Nutrition Goals Discharge (Final Nutrition Goals Re-Evaluation):  Nutrition Goals Re-Evaluation - 08/04/22 1542       Goals   Nutrition Goal Work on portion control.    Comment Curtis states he has been doing well with his diet. He feels like he does not need help with his current diet.    Expected Outcome Short: Continue to work on portion control. Long: Continue to practice heart  healthy eating after completing HeartTrack.             Psychosocial: Target Goals: Acknowledge presence or absence of significant depression and/or stress, maximize coping skills, provide positive support system. Participant is able to verbalize types and ability to use techniques and skills needed for reducing stress and depression.   Education: Stress, Anxiety, and Depression - Group verbal and visual presentation to define topics covered.  Reviews how body is impacted by stress, anxiety, and depression.  Also discusses healthy ways to reduce stress and to treat/manage anxiety and depression.  Written material given at graduation. Flowsheet Row Cardiac Rehab from 08/03/2022 in [facility] Cardiac and Pulmonary Rehab  Education need identified 05/04/22  Date 08/03/22  Educator KW  Instruction Review Code 1- [employer] Understanding       Education: Sleep Hygiene -Provides group verbal and written instruction about how sleep can affect your health.  Define sleep hygiene, discuss sleep cycles and impact of sleep habits. Review good sleep hygiene  tips.    Initial Review & Psychosocial Screening:  Initial Psych Review & Screening - 04/25/22 1036       Initial Review   Current issues with None Identified      Family Dynamics   Good Support System? Yes    Comments [place] daughter and step son are good support systems for him. He other Marlaine Hind looks after him as well. He lives by himself, his wife passed away four years ago.      Barriers   Psychosocial barriers to participate in program There are no identifiable barriers or psychosocial needs.;The patient should benefit from training in stress management and relaxation.      Screening Interventions   Interventions Encouraged to exercise;To provide support and resources with identified psychosocial needs;Provide feedback about the scores to participant    Expected Outcomes Short Term goal: Utilizing psychosocial counselor, staff and  physician to assist with identification of specific Stressors or current issues interfering with healing process. Setting desired goal for each stressor or current issue identified.;Long Term Goal: Stressors or current issues are controlled or eliminated.;Short Term goal: Identification and review with participant of any Quality of Life or Depression concerns found by scoring the questionnaire.;Long Term goal: The participant improves quality of Life and PHQ9 Scores as seen by post scores and/or verbalization of changes             Quality of Life Scores:   Quality of Life - 05/04/22 1459       Quality of Life   Select Quality of Life      Quality of Life Scores   Health/Function Pre 24.36 %    Socioeconomic Pre 25.86 %    Psych/Spiritual Pre 29.14 %    Family Pre 22.88 %    GLOBAL Pre 25.55 %            Scores of 19 and below usually indicate a poorer quality of life in these areas.  A difference of  2-3 points is a clinically meaningful difference.  A difference of 2-3 points in the total score of the Quality of Life Index has been associated with significant improvement in overall quality of life, self-image, physical symptoms, and general health in studies assessing change in quality of life.  PHQ-9: Review Flowsheet       06/06/2022 06/01/2022 05/04/2022  Depression screen PHQ 2/9  Decreased Interest 0 0 0  Down, Depressed, Hopeless 0 0 1  PHQ - 2 Score 0 0 1  Altered sleeping 0 0 0  Tired, decreased energy 1 1 1   Change in appetite 0 0 0  Feeling bad or failure about yourself  0 0 1  Trouble concentrating 0 0 0  Moving slowly or fidgety/restless 0 0 2  Suicidal thoughts 0 0 0  PHQ-9 Score 1 1 5   Difficult doing work/chores Not difficult at all Not difficult at all Not difficult at all   Interpretation of Total Score  Total Score Depression Severity:  1-4 = Minimal depression, 5-9 = Mild depression, 10-14 = Moderate depression, 15-19 = Moderately severe depression,  20-27 = Severe depression   Psychosocial Evaluation and Intervention:  Psychosocial Evaluation - 04/25/22 1038       Psychosocial Evaluation & Interventions   Interventions Encouraged to exercise with the program and follow exercise prescription;Stress management education;Relaxation education    Comments [place] daughter and step son are good support systems for him. He other Marlaine Hind looks after him as well. He lives  by himself, his wife passed away four years ago.    Expected Outcomes Short: Start HeartTrack to help with mood. Long: Maintain a healthy mental state    Continue Psychosocial Services  Follow up required by staff             Psychosocial Re-Evaluation:  Psychosocial Re-Evaluation     Row Name 06/09/22 1547 07/07/22 1543 08/04/22 1543         Psychosocial Re-Evaluation   Current issues with None Identified None Identified --     Comments Deniece PortelaWayne denies any major stressors at this time. He reports having a good support system made up by his children. He states that he also can turn to members of his church for support. He also states that he is sleeping well at this time. Deniece PortelaWayne also reports that puzzles and exercise are good avenues for stress relief as well. Patient reports no issues with their current mental states, sleep, stress, depression or anxiety. Will follow up with patient in a few weeks for any changes. Deniece PortelaWayne reports no current stressors at this time. He is doing well with his sleep also. He states that exercise has been a good stress reiever for him.     Expected Outcomes Short: Continue to attend rehab for stress relief. Long: Maintain positive outlook Short: Continue to exercise regularly to support mental health and notify staff of any changes. Long: maintain mental health and well being through teaching of rehab or prescribed medications independently. Short: Continue to exercise for stress relief. Long: Continue to maintain positive outlook.      Interventions -- Encouraged to attend Cardiac Rehabilitation for the exercise Encouraged to attend Cardiac Rehabilitation for the exercise     Continue Psychosocial Services  Follow up required by staff Follow up required by staff --              Psychosocial Discharge (Final Psychosocial Re-Evaluation):  Psychosocial Re-Evaluation - 08/04/22 1543       Psychosocial Re-Evaluation   Comments Deniece PortelaWayne reports no current stressors at this time. He is doing well with his sleep also. He states that exercise has been a good stress reiever for him.    Expected Outcomes Short: Continue to exercise for stress relief. Long: Continue to maintain positive outlook.    Interventions Encouraged to attend Cardiac Rehabilitation for the exercise             Vocational Rehabilitation: Provide vocational rehab assistance to qualifying candidates.   Vocational Rehab Evaluation & Intervention:   Education: Education Goals: Education classes will be provided on a variety of topics geared toward better understanding of heart health and risk factor modification. Participant will state understanding/return demonstration of topics presented as noted by education test scores.  Learning Barriers/Preferences:  Learning Barriers/Preferences - 04/25/22 1032       Learning Barriers/Preferences   Learning Barriers None    Learning Preferences None             General Cardiac Education Topics:  AED/CPR: - Group verbal and written instruction with the use of models to demonstrate the basic use of the AED with the basic ABC's of resuscitation.   Anatomy and Cardiac Procedures: - Group verbal and visual presentation and models provide information about basic cardiac anatomy and function. Reviews the testing methods done to diagnose heart disease and the outcomes of the test results. Describes the treatment choices: Medical Management, Angioplasty, or Coronary Bypass Surgery for treating various heart  conditions including Myocardial Infarction,  Angina, Valve Disease, and Cardiac Arrhythmias.  Written material given at graduation. Flowsheet Row Cardiac Rehab from 08/03/2022 in [facility] Cardiac and Pulmonary Rehab  Education need identified 05/04/22       Medication Safety: - Group verbal and visual instruction to review commonly prescribed medications for heart and lung disease. Reviews the medication, class of the drug, and side effects. Includes the steps to properly store meds and maintain the prescription regimen.  Written material given at graduation.   Intimacy: - Group verbal instruction through game format to discuss how heart and lung disease can affect sexual intimacy. Written material given at graduation..   Know Your Numbers and Heart Failure: - Group verbal and visual instruction to discuss disease risk factors for cardiac and pulmonary disease and treatment options.  Reviews associated critical values for Overweight/Obesity, Hypertension, Cholesterol, and Diabetes.  Discusses basics of heart failure: signs/symptoms and treatments.  Introduces Heart Failure Zone chart for action plan for heart failure.  Written material given at graduation. Flowsheet Row Cardiac Rehab from 08/03/2022 in [facility] Cardiac and Pulmonary Rehab  Date 05/11/22  Educator SB  Instruction Review Code 5- Refused Teaching       Infection Prevention: - Provides verbal and written material to individual with discussion of infection control including proper hand washing and proper equipment cleaning during exercise session. Flowsheet Row Cardiac Rehab from 08/03/2022 in [facility] Cardiac and Pulmonary Rehab  Date 04/25/22  Educator [facility]  Instruction Review Code 1- Verbalizes Understanding       Falls Prevention: - Provides verbal and written material to individual with discussion of falls prevention and safety. Flowsheet Row Cardiac Rehab from 08/03/2022 in [facility] Cardiac and Pulmonary Rehab  Date 04/25/22  Educator  [facility]  Instruction Review Code 1- Verbalizes Understanding       Other: -Provides group and verbal instruction on various topics (see comments)   Knowledge Questionnaire Score:  Knowledge Questionnaire Score - 05/04/22 1501       Knowledge Questionnaire Score   Pre Score 18/26             Core Components/Risk Factors/Patient Goals at Admission:  Personal Goals and Risk Factors at Admission - 05/04/22 1501       Core Components/Risk Factors/Patient Goals on Admission    Weight Management Yes;Weight Maintenance    Intervention Weight Management: Develop a combined nutrition and exercise program designed to reach desired caloric intake, while maintaining appropriate intake of nutrient and fiber, sodium and fats, and appropriate energy expenditure required for the weight goal.;Weight Management: Provide education and appropriate resources to help participant work on and attain dietary goals.;Weight Management/Obesity: Establish reasonable short term and long term weight goals.    Admit Weight 184 lb 6.4 oz (83.6 kg)    Goal Weight: Short Term 180 lb (81.6 kg)    Goal Weight: Long Term 175 lb (79.4 kg)    Expected Outcomes Short Term: Continue to assess and modify interventions until short term weight is achieved;Long Term: Adherence to nutrition and physical activity/exercise program aimed toward attainment of established weight goal;Weight Maintenance: Understanding of the daily nutrition guidelines, which includes 25-35% calories from fat, 7% or less cal from saturated fats, less than 200mg  cholesterol, less than 1.5gm of sodium, & 5 or more servings of fruits and vegetables daily;Understanding recommendations for meals to include 15-35% energy as protein, 25-35% energy from fat, 35-60% energy from carbohydrates, less than 200mg  of dietary cholesterol, 20-35 gm of total fiber daily;Understanding of distribution of calorie intake throughout the  day with the consumption of 4-5 meals/snacks     Diabetes Yes    Intervention Provide education about signs/symptoms and action to take for hypo/hyperglycemia.;Provide education about proper nutrition, including hydration, and aerobic/resistive exercise prescription along with prescribed medications to achieve blood glucose in normal ranges: Fasting glucose 65-99 mg/dL    Expected Outcomes Short Term: Participant verbalizes understanding of the signs/symptoms and immediate care of hyper/hypoglycemia, proper foot care and importance of medication, aerobic/resistive exercise and nutrition plan for blood glucose control.;Long Term: Attainment of HbA1C < 7%.    Hypertension Yes    Intervention Provide education on lifestyle modifcations including regular physical activity/exercise, weight management, moderate sodium restriction and increased consumption of fresh fruit, vegetables, and low fat dairy, alcohol moderation, and smoking cessation.;Monitor prescription use compliance.    Expected Outcomes Short Term: Continued assessment and intervention until BP is < 140/33mm HG in hypertensive participants. < 130/38mm HG in hypertensive participants with diabetes, heart failure or chronic kidney disease.;Long Term: Maintenance of blood pressure at goal levels.    Lipids Yes    Intervention Provide education and support for participant on nutrition & aerobic/resistive exercise along with prescribed medications to achieve LDL 70mg , HDL >40mg .    Expected Outcomes Short Term: Participant states understanding of desired cholesterol values and is compliant with medications prescribed. Participant is following exercise prescription and nutrition guidelines.;Long Term: Cholesterol controlled with medications as prescribed, with individualized exercise RX and with personalized nutrition plan. Value goals: LDL < 70mg , HDL > 40 mg.             Education:Diabetes - Individual verbal and written instruction to review signs/symptoms of diabetes, desired ranges of  glucose level fasting, after meals and with exercise. Acknowledge that pre and post exercise glucose checks will be done for 3 sessions at entry of program. [place] from 08/03/2022 in [facility] Cardiac and Pulmonary Rehab  Date 04/25/22  Educator [facility]  Instruction Review Code 1- Verbalizes Understanding       Core Components/Risk Factors/Patient Goals Review:   Goals and Risk Factor Review     Row Name 06/09/22 1558 07/07/22 1545 08/04/22 1546         Core Components/Risk Factors/Patient Goals Review   Personal Goals Review Weight Management/Obesity;Diabetes;Hypertension Weight Management/Obesity Weight Management/Obesity;Diabetes     Review Pasqual is doing well with his weight, but feels he could benefit from losing a little bit. He reports that he has been checking his blood sugar levels at home and they are staying within normal ranges. He has not been checking his BP at home because he does not have a BP cuff. He is going to look into getting a cuff for his home. Suezanne Jacquet wants to reach a weight goal of 175lbs. He wants his blood sugars to be even better with weight loss. This morning his sugar was 173. He is going to try to lose 5 pounds in the next couple months. Suezanne Jacquet wants to reach a weight goal of 175 lbs. He wants his blood sugars to be even better with weight loss. He is going to try to lose 5 pounds in the next couple months. He does not currently have a BP cuff at home but will talk to the [place] about getting one.     Expected Outcomes Short: Start to monitor BP at home. Long: Continue to manage lifestyle risk factors. Short: lose couple a pounds in the next two weeks. Long: reach weight goal of 175lbs. Short: talk to [place] about  getting BP cuff. Long: reach weight goal of 175lbs.              Core Components/Risk Factors/Patient Goals at Discharge (Final Review):   Goals and Risk Factor Review - 08/04/22 1546       Core Components/Risk Factors/Patient Goals Review    Personal Goals Review Weight Management/Obesity;Diabetes    Review Alinda Money wants to reach a weight goal of 175 lbs. He wants his blood sugars to be even better with weight loss. He is going to try to lose 5 pounds in the next couple months. He does not currently have a BP cuff at home but will talk to the [place] about getting one.    Expected Outcomes Short: talk to [place] about getting BP cuff. Long: reach weight goal of 175lbs.             ITP Comments:  ITP Comments     Row Name 04/25/22 1035 05/04/22 1459 05/05/22 1607 06/01/22 0959 06/29/22 0839   ITP Comments Virtual Visit completed. Patient informed on EP and RD appointment and 6 Minute walk test. Patient also informed of patient health questionnaires on My Chart. Patient Verbalizes understanding. Visit diagnosis can be found in [facility] 02/14/[facility]22. Completed and gym orientation. Initial ITP created and sent for review to Dr. Bethann Punches, Medical Director. First full day of exercise!  Patient was oriented to gym and equipment including functions, settings, policies, and procedures.  Patient's individual exercise prescription and treatment plan were reviewed.  All starting workloads were established based on the results of the 6 minute walk test done at initial orientation visit.  The plan for exercise progression was also introduced and progression will be customized based on patient's performance and goals. 30 Day review completed. Medical Director ITP review done, changes made as directed, and signed approval by Medical Director.   NEW TO PROGRAM 30 Day review completed. Medical Director ITP review done, changes made as directed, and signed approval by Medical Director.    Row Name 07/27/22 1103 08/10/22 1619         ITP Comments 30 Day review completed. Medical Director ITP review done, changes made as directed, and signed approval by Medical Director. Aden graduated today from  rehab with 36 sessions completed.  Details of the patient's exercise  prescription and what He needs to do in order to continue the prescription and progress were discussed with patient.  Patient was given a copy of prescription and goals.  Patient verbalized understanding.  Talvin plans to continue to exercise by walking and using resistance bands .               Comments: Discharge ITP

## 2022-08-10 NOTE — Progress Notes (Signed)
Discharge Summary   Dustin Lara  DOB: 04/07/2041  Dustin Lara graduated today from  rehab with 36 sessions completed.  Details of the patient's exercise prescription and what Dustin Lara needs to do in order to continue the prescription and progress were discussed with patient.  Patient was given a copy of prescription and goals.  Patient verbalized understanding.  Dustin Lara plans to continue to exercise by walking and using resistance bands .   Dustin Lara Name 05/04/22 1504 07/20/22 1554       6 Minute Walk   Phase Initial Discharge    Distance 880 feet 1395 feet    Distance % Change -- 58.5 %    Distance Feet Change -- 515 ft    Walk Time 6 minutes 6 minutes    # of Rest Breaks 0 0    MPH 1.67 2.64    METS 1.55 2.46    RPE 11 12    Perceived Dyspnea  1 0    VO2 Peak 5.43 8.61    Symptoms Yes (comment) No    Comments Chest Tightness, SOB --    Resting HR 64 bpm 62 bpm    Resting BP 128/56 120/62    Resting Oxygen Saturation  97 % 98 %    Exercise Oxygen Saturation  during 6 min walk 99 % 99 %    Max Ex. HR 103 bpm 110 bpm    Max Ex. BP 148/62 144/66    2 Minute Post BP 136/66 --
